# Patient Record
Sex: Female | Born: 1990
Health system: Southern US, Community
[De-identification: ages and names within clinical notes are randomized; demographics above are authoritative.]

## PROBLEM LIST (undated history)

## (undated) ENCOUNTER — Ambulatory Visit: Admission: EM | Payer: BC Managed Care – PPO | Source: Home / Self Care

---
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
## 2010-01-15 ENCOUNTER — Emergency Department: Payer: Self-pay | Admitting: Emergency Medicine

## 2010-01-19 ENCOUNTER — Emergency Department: Payer: Self-pay | Admitting: Emergency Medicine

## 2013-10-18 ENCOUNTER — Emergency Department: Payer: Self-pay | Admitting: Emergency Medicine

## 2013-12-12 ENCOUNTER — Emergency Department: Payer: Self-pay | Admitting: Emergency Medicine

## 2013-12-12 LAB — URINALYSIS, COMPLETE
BLOOD: NEGATIVE
Bacteria: NONE SEEN
Bilirubin,UR: NEGATIVE
Glucose,UR: NEGATIVE mg/dL (ref 0–75)
KETONE: NEGATIVE
LEUKOCYTE ESTERASE: NEGATIVE
NITRITE: NEGATIVE
PH: 7 (ref 4.5–8.0)
Protein: 30
RBC,UR: 34 /HPF (ref 0–5)
SPECIFIC GRAVITY: 1.027 (ref 1.003–1.030)
Squamous Epithelial: 16

## 2013-12-14 ENCOUNTER — Emergency Department: Payer: Self-pay | Admitting: Emergency Medicine

## 2013-12-15 ENCOUNTER — Emergency Department: Payer: Self-pay | Admitting: Emergency Medicine

## 2014-06-30 ENCOUNTER — Ambulatory Visit: Payer: Self-pay | Admitting: Physician Assistant

## 2014-08-04 ENCOUNTER — Ambulatory Visit: Payer: Self-pay | Admitting: Physician Assistant

## 2015-07-22 ENCOUNTER — Other Ambulatory Visit
Admission: RE | Admit: 2015-07-22 | Discharge: 2015-07-22 | Disposition: A | Discharge status: Home / Self Care | Payer: Self-pay | Source: Ambulatory Visit | Attending: Family Medicine | Admitting: Family Medicine

## 2015-07-22 ENCOUNTER — Ambulatory Visit (INDEPENDENT_AMBULATORY_CARE_PROVIDER_SITE_OTHER): Payer: Self-pay | Admitting: Family Medicine

## 2015-07-22 ENCOUNTER — Encounter: Payer: Self-pay | Admitting: Family Medicine

## 2015-07-22 VITALS — BP 90/60 | HR 86 | Temp 98.8°F | Resp 14 | Ht 61.0 in | Wt 144.0 lb

## 2015-07-22 DIAGNOSIS — Z0289 Encounter for other administrative examinations: Secondary | ICD-10-CM

## 2015-07-22 DIAGNOSIS — Z020 Encounter for examination for admission to educational institution: Secondary | ICD-10-CM | POA: Insufficient documentation

## 2015-07-22 DIAGNOSIS — Z23 Encounter for immunization: Secondary | ICD-10-CM

## 2015-07-22 LAB — POCT URINALYSIS DIPSTICK
Bilirubin, UA: NEGATIVE
Blood, UA: NEGATIVE
Glucose, UA: NEGATIVE
Ketones, UA: NEGATIVE
Leukocytes, UA: NEGATIVE
Nitrite, UA: NEGATIVE
PROTEIN UA: NEGATIVE
Spec Grav, UA: 1.02
UROBILINOGEN UA: 0.2
pH, UA: 5

## 2015-07-22 NOTE — Progress Notes (Signed)
Name: Molli BarrowsJasmine Leshawn Wambolt   MRN: [id number]    DOB: 12/14/1990   Date:07/22/2015       Progress Note  Subjective  Chief Complaint  Chief Complaint  Patient presents with  . 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   No problem-specific assessment & plan notes found for this encounter.   History reviewed. No pertinent past medical history.  History reviewed. No pertinent past surgical history.  History reviewed. No pertinent family history.  Social History   Social History  . Marital Status: Single    Spouse Name: N/A  . Number of Children: N/A  . Years of Education: N/A   Occupational History  . Not on file.   Social History Main Topics  . Smoking status: Current Every Day Smoker  . Smokeless tobacco: Not on file  . Alcohol Use: Not on file  . Drug Use: Not on file  . Sexual Activity: Yes   Other Topics Concern  . Not on file   Social History Narrative  . No narrative on file    No Known Allergies   Review of Systems  Constitutional: Negative for fever, chills, weight loss and malaise/fatigue.  HENT: Negative for ear discharge, ear pain and sore throat.   Eyes: Negative for blurred vision.  Respiratory: Negative for cough, sputum production, shortness of breath and wheezing.   Cardiovascular: Negative for chest pain, palpitations and leg swelling.  Gastrointestinal: Negative for heartburn, nausea, abdominal pain, diarrhea, constipation, blood in stool and melena.  Genitourinary: Negative for dysuria, urgency, frequency and hematuria.  Musculoskeletal: Negative for myalgias, back pain, joint pain and neck pain.  Skin: Negative for rash.  Neurological: Negative for dizziness, tingling, sensory change, focal weakness and headaches.  Endo/Heme/Allergies: Negative for environmental allergies and polydipsia. Does not bruise/bleed easily.  Psychiatric/Behavioral: Negative for depression and suicidal  ideas. The patient is not nervous/anxious and does not have insomnia.      Objective  Filed Vitals:   07/22/15 0852  BP: 90/60  Pulse: 86  Temp: 98.8 F (37.1 C)  TempSrc: Oral  Resp: 14  Height: 5\' 1"  (1.549 m)  Weight: 144 lb (65.318 kg)    Physical Exam  Constitutional: She is oriented to person, place, and time and well-developed, well-nourished, and in no distress. No distress.  HENT:  Head: Normocephalic and atraumatic.  Right Ear: External ear normal.  Left Ear: External ear normal.  Nose: Nose normal.  Mouth/Throat: Oropharynx is clear and moist.  Eyes: Conjunctivae and EOM are normal. Pupils are equal, round, and reactive to light. Right eye exhibits no discharge. Left eye exhibits no discharge.  Neck: Normal range of motion. Neck supple. No JVD present. No thyromegaly present.  Cardiovascular: Normal rate, regular rhythm, normal heart sounds and intact distal pulses.  Exam reveals no gallop and no friction rub.   No murmur heard. Pulmonary/Chest: Effort normal and breath sounds normal.  Abdominal: Soft. Bowel sounds are normal. She exhibits no mass. There is no tenderness. There is no guarding.  Musculoskeletal: Normal range of motion. She exhibits no edema.  Lymphadenopathy:    She has no cervical adenopathy.  Neurological: She is alert and oriented to person, place, and time. She has normal sensation, normal strength, normal reflexes and intact cranial nerves.  Skin: Skin is warm and dry. She is not diaphoretic.  Psychiatric: Mood and affect normal.      Assessment & Plan  Problem  List Items Addressed This Visit    None    Visit Diagnoses    School physical exam    -  Primary    Relevant Orders    Hepatitis B Surface AntiBODY    Varicella Zoster Abs, IgG/IgM    Measles/Mumps/Rubella Immunity    POCT Urinalysis Dipstick (Completed)    Need for Tdap vaccination        Relevant Orders    Tdap vaccine greater than or equal to 7yo IM (Completed)          Dr. Hayden [facility]  07/22/2015

## 2015-07-23 LAB — MEASLES/MUMPS/RUBELLA IMMUNITY
Mumps IgG: 137 AU/mL (ref >10.9)
RUBELLA: 11.5 {index} (ref >0.99)
Rubeola IgG: >300 AU/mL (ref >29.9)

## 2015-07-23 LAB — MISC LABCORP TEST (SEND OUT): LABCORP TEST CODE: 96776

## 2015-07-23 LAB — VARICELLA ZOSTER ANTIBODY, IGG: Varicella IgG: 457 index (ref >165)

## 2015-07-23 LAB — HEPATITIS B SURFACE ANTIBODY, QUANTITATIVE: HEPATITIS B-POST: 299.1 m[IU]/mL

## 2015-08-23 IMAGING — CR DG FOOT COMPLETE 3+V*L*
1 series · 3 of 3 positions shown · non-contrast
Comparison: None.

CLINICAL DATA: Pain post trauma

EXAM:
LEFT FOOT - COMPLETE 3+ VIEW

[Series 1: ap · 0.17mm/px · 3 of 3 slices shown]
[im 1/3]
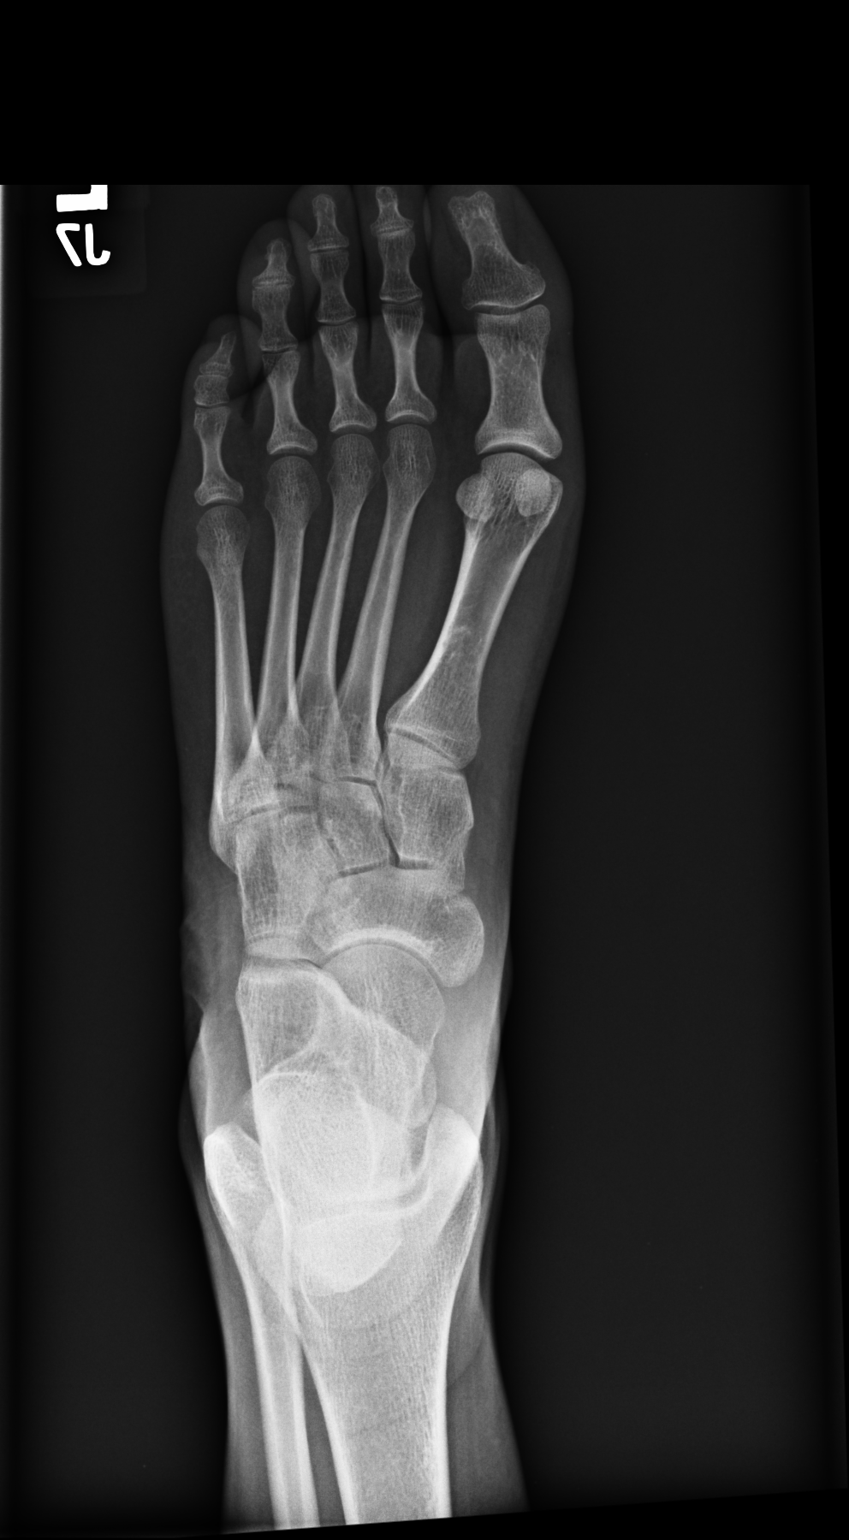
[im 2/3]
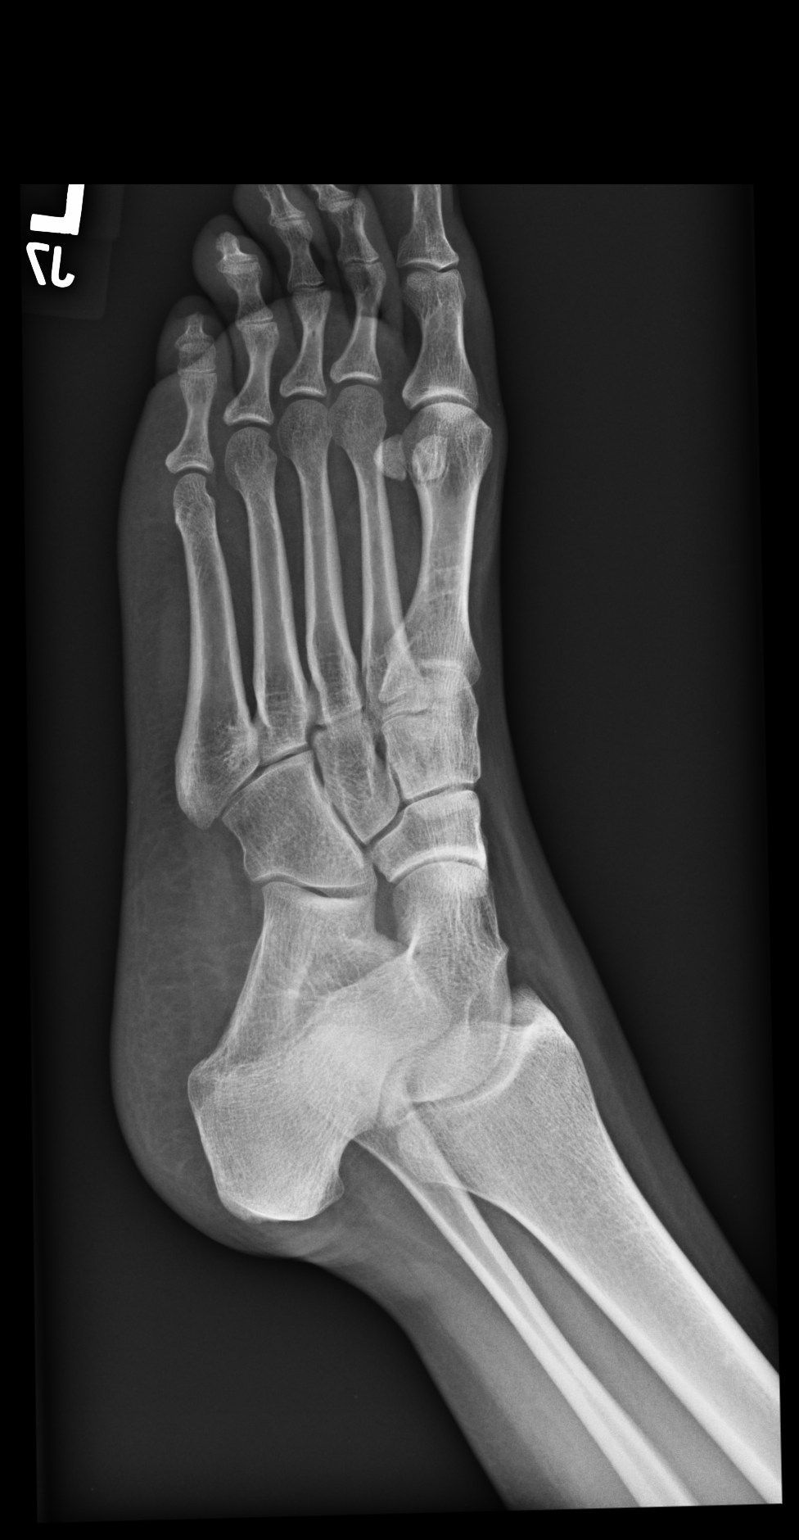
[im 3/3]
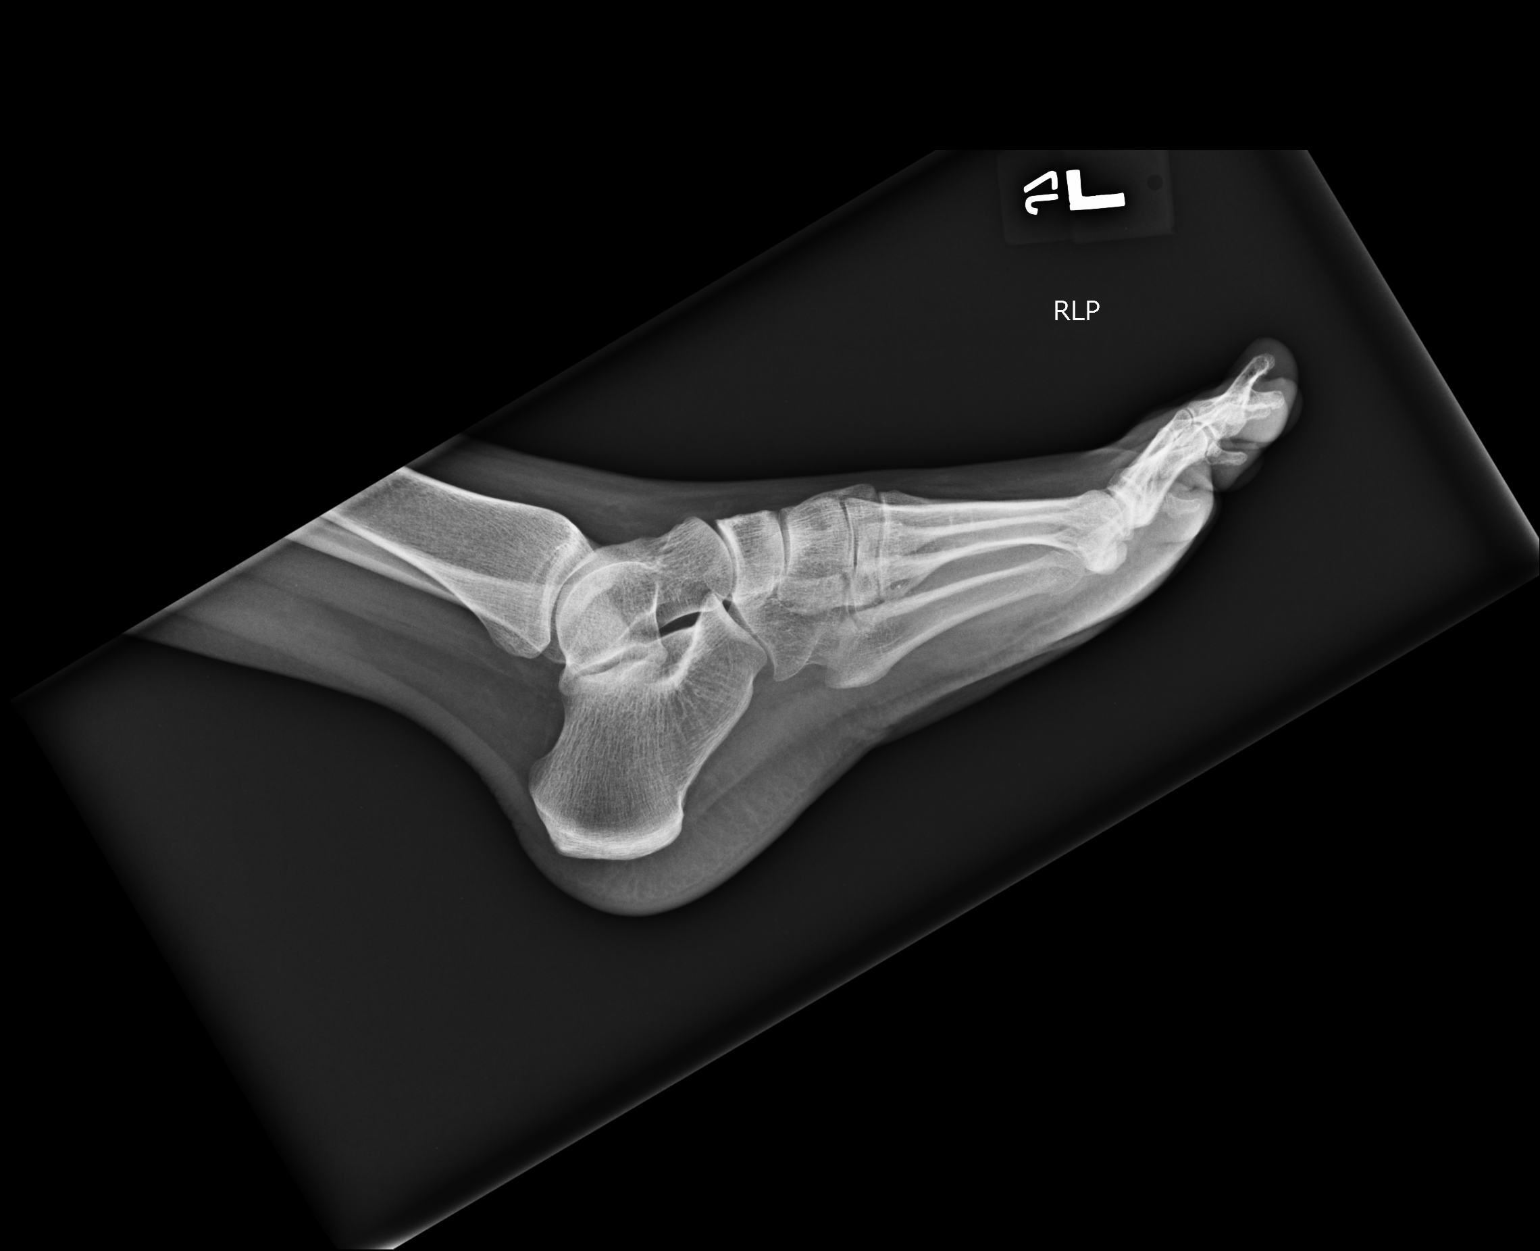

[3 of 3 positions shown; findings below may reference images not displayed]

FINDINGS: Frontal, oblique, and lateral views were obtained. There is no
fracture or dislocation. Joint spaces appear intact. No erosive
change.
IMPRESSION: No abnormality noted.

## 2017-04-15 ENCOUNTER — Emergency Department
Admission: EM | Admit: 2017-04-15 | Discharge: 2017-04-15 | Disposition: A | Discharge status: Home / Self Care | Payer: Managed Care, Other (non HMO) | Attending: Emergency Medicine | Admitting: Emergency Medicine

## 2017-04-15 ENCOUNTER — Emergency Department: Payer: Managed Care, Other (non HMO)

## 2017-04-15 ENCOUNTER — Encounter: Payer: Self-pay | Admitting: Emergency Medicine

## 2017-04-15 DIAGNOSIS — R1032 Left lower quadrant pain: Secondary | ICD-10-CM | POA: Diagnosis present

## 2017-04-15 DIAGNOSIS — F17200 Nicotine dependence, unspecified, uncomplicated: Secondary | ICD-10-CM | POA: Diagnosis not present

## 2017-04-15 LAB — COMPREHENSIVE METABOLIC PANEL
ALBUMIN: 4.1 g/dL (ref 3.5–5.0)
ALT: 11 U/L — ABNORMAL LOW (ref 14–54)
ANION GAP: 7 (ref 5–15)
AST: 22 U/L (ref 15–41)
Alkaline Phosphatase: 76 U/L (ref 38–126)
BUN: 11 mg/dL (ref 6–20)
CO2: 25 mmol/L (ref 22–32)
Calcium: 9 mg/dL (ref 8.9–10.3)
Chloride: 106 mmol/L (ref 101–111)
Creatinine, Ser: 0.81 mg/dL (ref 0.44–1.00)
GFR calc Af Amer: >60 mL/min (ref >60)
GFR calc non Af Amer: >60 mL/min (ref >60)
GLUCOSE: 118 mg/dL — AB (ref 65–99)
POTASSIUM: 3.6 mmol/L (ref 3.5–5.1)
SODIUM: 138 mmol/L (ref 135–145)
TOTAL PROTEIN: 7.6 g/dL (ref 6.5–8.1)
Total Bilirubin: 0.3 mg/dL (ref 0.3–1.2)

## 2017-04-15 LAB — URINALYSIS, COMPLETE (UACMP) WITH MICROSCOPIC
Bilirubin Urine: NEGATIVE
Glucose, UA: NEGATIVE mg/dL
Ketones, ur: NEGATIVE mg/dL
Leukocytes, UA: NEGATIVE
NITRITE: NEGATIVE
PROTEIN: 30 mg/dL — AB
Specific Gravity, Urine: 1.026 (ref 1.005–1.030)
pH: 5 (ref 5.0–8.0)

## 2017-04-15 LAB — CBC
HCT: 40.9 % (ref 35.0–47.0)
HEMOGLOBIN: 14 g/dL (ref 12.0–16.0)
MCH: 29.4 pg (ref 26.0–34.0)
MCHC: 34.1 g/dL (ref 32.0–36.0)
MCV: 86.3 fL (ref 80.0–100.0)
Platelets: 408 10*3/uL (ref 150–440)
RBC: 4.74 MIL/uL (ref 3.80–5.20)
RDW: 13 % (ref 11.5–14.5)
WBC: 16.5 10*3/uL — ABNORMAL HIGH (ref 3.6–11.0)

## 2017-04-15 LAB — WET PREP, GENITAL
Clue Cells Wet Prep HPF POC: NONE SEEN
SPERM: NONE SEEN
Trich, Wet Prep: NONE SEEN
Yeast Wet Prep HPF POC: NONE SEEN

## 2017-04-15 LAB — POCT PREGNANCY, URINE: PREG TEST UR: NEGATIVE

## 2017-04-15 LAB — CHLAMYDIA/NGC RT PCR (ARMC ONLY)
Chlamydia Tr: NOT DETECTED
N GONORRHOEAE: NOT DETECTED

## 2017-04-15 LAB — LIPASE, BLOOD: Lipase: 24 U/L (ref 11–51)

## 2017-04-15 MED ORDER — AZITHROMYCIN 500 MG PO TABS
1000.0000 mg | ORAL_TABLET | Freq: Once | ORAL | Status: AC
  Administered 2017-04-15: 1000 mg via ORAL
  Filled 2017-04-15: qty 2

## 2017-04-15 MED ORDER — CEFTRIAXONE SODIUM 250 MG IJ SOLR
250.0000 mg | Freq: Once | INTRAMUSCULAR | Status: AC
  Administered 2017-04-15: 250 mg via INTRAMUSCULAR
  Filled 2017-04-15: qty 250

## 2017-04-15 NOTE — ED Notes (Addendum)
Pt. States pain to lt. Flank that radiates to back and down lt. Leg.  Pt. States symptoms started 2 weeks ago, but has gotten progressively worse.  Pt. States when she sits up lt. Flank/LLQ pain.

## 2017-04-15 NOTE — ED Provider Notes (Signed)
[facility] Emergency Department Provider Note  ____________________________________________   First MD Initiated Contact with Patient 04/15/17 0153     (approximate)  I have reviewed the triage vital signs and the nursing notes.   HISTORY  Chief Complaint Abdominal Pain    HPI Isabella Smith is a 26 y.o. female who self presents to the emergency department with 2 weeks of left lower quadrant abdominal pain. Associated with nausea but no vomiting. The pain is constant and always there at least in a mild way although comes in waves that are more intense. Nothing in particular seems to make it better or worse. It occasionally radiates down her left leg and up her left flank. She denies dysuria frequency or hesitancy. Shedenies abdominal surgical history. She denies diarrhea or constipation. She is currently menstruating.   No past medical history on file.  There are no active problems to display for this patient.   No past surgical history on file.  Prior to Admission medications   Not on File    Allergies Patient has no known allergies.  No family history on file.  Social History Social History  Substance Use Topics  . Smoking status: Current Every Day Smoker  . Smokeless tobacco: Not on file  . Alcohol use Not on file    Review of Systems Constitutional: No fever/chills Eyes: No visual changes. ENT: No sore throat. Cardiovascular: Denies chest pain. Respiratory: Denies shortness of breath. Gastrointestinal: Positiveabdominal pain.  Positivenausea, no vomiting.  No diarrhea.  No constipation. Genitourinary: Negative for dysuria. Musculoskeletal:  Positivefor back pain. Skin: Negative for rash. Neurological: Negative for headaches, focal weakness or numbness.   ____________________________________________   PHYSICAL EXAM:  VITAL SIGNS: ED Triage Vitals  Enc Vitals Group     BP 04/15/17 0028 119/79     Pulse Rate  04/15/17 0028 81     Resp 04/15/17 0028 18     Temp 04/15/17 0028 98.8 F (37.1 C)     Temp Source 04/15/17 0028 Oral     SpO2 04/15/17 0028 100 %     Weight 04/15/17 0027 148 lb (67.1 kg)     Height 04/15/17 0027 5\' 1"  (1.549 m)     Head Circumference --      Peak Flow --      Pain Score 04/15/17 0025 0     Pain Loc --      Pain Edu? --      Excl. in GC? --     Constitutional: Alert and oriented 4 pleasant cooperative speaks in full clear sentences no diaphoresis Eyes: PERRL EOMI. Head: Atraumatic. Nose: No congestion/rhinnorhea. Mouth/Throat: No trismus Neck: No stridor.   Cardiovascular: Normal rate, regular rhythm. Grossly normal heart sounds.  Good peripheral circulation. Respiratory: Normal respiratory effort.  No retractions. Lungs CTAB and moving good air Gastrointestinal:  Soft nondistended mild left lower quadrant tenderness with no rebound or guarding no peritonitis no costovertebral tenderness Genitourinary: Pelvic exam performed with female chaperone nurse Rebecca: Os closed small amount of dried blood in the vault no cervical motion tenderness no adnexal tenderness or uterine tenderness Musculoskeletal: No lower extremity edema   Neurologic:  Normal speech and language. No gross focal neurologic deficits are appreciated. Skin:  Skin is warm, dry and intact. No rash noted. Psychiatric: Mood and affect are normal. Speech and behavior are normal.    ____________________________________________   DIFFERENTIAL includes but not limited to  ovarian cyst, ovarian torsion, pyelonephritis, urinary tract infection, kidney  stone, diverticulitis ____________________________________________   LABS (all labs ordered are listed, but only abnormal results are displayed)  Labs Reviewed  WET PREP, GENITAL - Abnormal; Notable for the following:       Result Value   WBC, Wet Prep HPF POC MODERATE (*)    All other components within normal limits  COMPREHENSIVE METABOLIC PANEL  - Abnormal; Notable for the following:    Glucose, Bld 118 (*)    ALT 11 (*)    All other components within normal limits  CBC - Abnormal; Notable for the following:    WBC 16.5 (*)    All other components within normal limits  URINALYSIS, COMPLETE (UACMP) WITH MICROSCOPIC - Abnormal; Notable for the following:    Color, Urine YELLOW (*)    APPearance HAZY (*)    Hgb urine dipstick LARGE (*)    Protein, ur 30 (*)    Bacteria, UA RARE (*)    Squamous Epithelial / LPF 0-5 (*)    All other components within normal limits  CHLAMYDIA/NGC RT PCR ([facility] ONLY)  LIPASE, BLOOD  POC URINE PREG, ED  POCT PREGNANCY, URINE   Hematuria with no evidence of infection elevated white count is nonspecific __________________________________________  EKG   ____________________________________________  RADIOLOGY  Pelvic ultrasound with no acute disease CT stone with no acute disease noted ____________________________________________   PROCEDURES  Procedure(s) performed: no  Procedures  Critical Care performed: no  Observation: no ____________________________________________   INITIAL IMPRESSION / ASSESSMENT AND PLAN / ED COURSE  Pertinent labs & imaging results that were available during my care of the patient were reviewed by me and considered in my medical decision making (see chart for details).  The patient arrives well-appearing with a relatively benign abdominal exam. No costovertebral tenderness. Doubt infectious etiology. She does report constant aching left lower quadrant pain and it seems to be worse now that she is menstruating which could be ovarian in etiology. Her history is not entirely consistent with ovarian torsion. Ultrasound is pending and if the ultrasound is negative we may possibly pursue a CT stone protocol.    ----------------------------------------- 3:49 AM on 04/15/2017 -----------------------------------------  The patient's pelvic ultrasound  is negative for acute pathology. We discussed other etiologies of left lower quadrant pain and the patient stated that she was treated for gonorrhea in March of this year and is concerned she may have recurrent disease. Pelvic exam performed and is unremarkable. The other etiology and concerned about would be a kidney stone so I will order a CT stone now.   The patient's CT scan is negative for acute disease. Unclear etiology of her pelvic pain. I offered the patient presumptive treatment for gonorrhea and chlamydia now versus waiting for the results and possible treatment tomorrow and she opted for treatment today which I think is reasonable. She clearly does not have a tubo-ovarian abscess. Ceftriaxone and azithromycin now and outpatient management. ____________________________________________   FINAL CLINICAL IMPRESSION(S) / ED DIAGNOSES  Final diagnoses:  Left lower quadrant pain      NEW MEDICATIONS STARTED DURING THIS VISIT:  There are no discharge medications for this patient.    Note:  This document was prepared using Dragon voice recognition software and may include unintentional dictation errors.     Merrily Brittleifenbark, Tongela Encinas, MD 04/15/17 0630

## 2017-04-15 NOTE — ED Triage Notes (Signed)
Patient reports left lower abdominal pain that radiates down left leg and into back.  Patient denies urinary symptoms.

## 2017-04-15 NOTE — Discharge Instructions (Signed)
Please make an appointment to follow-up with your primary care physician next week for recheck and return to the emergency department sooner for any concerns.  It was a pleasure to take care of you today, and thank you for coming to our emergency department.  If you have any questions or concerns before leaving please ask the nurse to grab me and I'm more than happy to go through your aftercare instructions again.  If you were prescribed any opioid pain medication today such as Norco, Vicodin, Percocet, morphine, hydrocodone, or oxycodone please make sure you do not drive when you are taking this medication as it can alter your ability to drive safely.  If you have any concerns once you are home that you are not improving or are in fact getting worse before you can make it to your follow-up appointment, please do not hesitate to call 911 and come back for further evaluation.  Merrily BrittleNeil Ashley Bultema, MD  Results for orders placed or performed during the hospital encounter of 04/15/17  Wet prep, genital  Result Value Ref Range   Yeast Wet Prep HPF POC NONE SEEN NONE SEEN   Trich, Wet Prep NONE SEEN NONE SEEN   Clue Cells Wet Prep HPF POC NONE SEEN NONE SEEN   WBC, Wet Prep HPF POC MODERATE (A) NONE SEEN   Sperm NONE SEEN   Lipase, blood  Result Value Ref Range   Lipase 24 11 - 51 U/L  Comprehensive metabolic panel  Result Value Ref Range   Sodium 138 135 - 145 mmol/L   Potassium 3.6 3.5 - 5.1 mmol/L   Chloride 106 101 - 111 mmol/L   CO2 25 22 - 32 mmol/L   Glucose, Bld 118 (H) 65 - 99 mg/dL   BUN 11 6 - 20 mg/dL   Creatinine, Ser 1.610.81 0.44 - 1.00 mg/dL   Calcium 9.0 8.9 - 09.610.3 mg/dL   Total Protein 7.6 6.5 - 8.1 g/dL   Albumin 4.1 3.5 - 5.0 g/dL   AST 22 15 - 41 U/L   ALT 11 (L) 14 - 54 U/L   Alkaline Phosphatase 76 38 - 126 U/L   Total Bilirubin 0.3 0.3 - 1.2 mg/dL   GFR calc non Af Amer >60 >60 mL/min   GFR calc Af Amer >60 >60 mL/min   Anion gap 7 5 - 15  CBC  Result Value Ref Range    WBC 16.5 (H) 3.6 - 11.0 K/uL   RBC 4.74 3.80 - 5.20 MIL/uL   Hemoglobin 14.0 12.0 - 16.0 g/dL   HCT 04.540.9 40.935.0 - 81.147.0 %   MCV 86.3 80.0 - 100.0 fL   MCH 29.4 26.0 - 34.0 pg   MCHC 34.1 32.0 - 36.0 g/dL   RDW 91.413.0 78.211.5 - 95.614.5 %   Platelets 408 150 - 440 K/uL  Urinalysis, Complete w Microscopic  Result Value Ref Range   Color, Urine YELLOW (A) YELLOW   APPearance HAZY (A) CLEAR   Specific Gravity, Urine 1.026 1.005 - 1.030   pH 5.0 5.0 - 8.0   Glucose, UA NEGATIVE NEGATIVE mg/dL   Hgb urine dipstick LARGE (A) NEGATIVE   Bilirubin Urine NEGATIVE NEGATIVE   Ketones, ur NEGATIVE NEGATIVE mg/dL   Protein, ur 30 (A) NEGATIVE mg/dL   Nitrite NEGATIVE NEGATIVE   Leukocytes, UA NEGATIVE NEGATIVE   RBC / HPF 6-30 0 - 5 RBC/hpf   WBC, UA 0-5 0 - 5 WBC/hpf   Bacteria, UA RARE (A) NONE SEEN  Squamous Epithelial / LPF 0-5 (A) NONE SEEN   Mucous PRESENT   Pregnancy, urine POC  Result Value Ref Range   Preg Test, Ur NEGATIVE NEGATIVE   [place] Transvaginal Non-ob  Result Date: 04/15/2017 CLINICAL DATA:  Initial evaluation for acute left flank and lower quadrant pain for 2 weeks. EXAM: TRANSABDOMINAL AND TRANSVAGINAL ULTRASOUND OF PELVIS DOPPLER ULTRASOUND OF OVARIES TECHNIQUE: Both transabdominal and transvaginal ultrasound examinations of the pelvis were performed. Transabdominal technique was performed for global imaging of the pelvis including uterus, ovaries, adnexal regions, and pelvic cul-de-sac. It was necessary to proceed with endovaginal exam following the transabdominal exam to visualize the uterus and ovaries. Color and duplex Doppler ultrasound was utilized to evaluate blood flow to the ovaries. COMPARISON:  None. FINDINGS: Uterus Measurements: 7.3 x 3.0 x 3.9 cm. No fibroids or other mass visualized. Endometrium Thickness: 4.7 mm.  No focal abnormality visualized. Right ovary Measurements: 3.0 x 1.3 x 2.7 cm. Normal appearance/no adnexal mass. Left ovary Measurements: 3.0 x 1.2 x 3.3 cm.  Normal appearance/no adnexal mass. Pulsed Doppler evaluation of both ovaries demonstrates normal low-resistance arterial and venous waveforms. Other findings No abnormal free fluid. IMPRESSION: Normal pelvic ultrasound.  No acute abnormality identified. Electronically Signed   By: Rise MuBenjamin  McClintock M.D.   On: 04/15/2017 03:33   [place] Pelvis Complete  Result Date: 04/15/2017 CLINICAL DATA:  Initial evaluation for acute left flank and lower quadrant pain for 2 weeks. EXAM: TRANSABDOMINAL AND TRANSVAGINAL ULTRASOUND OF PELVIS DOPPLER ULTRASOUND OF OVARIES TECHNIQUE: Both transabdominal and transvaginal ultrasound examinations of the pelvis were performed. Transabdominal technique was performed for global imaging of the pelvis including uterus, ovaries, adnexal regions, and pelvic cul-de-sac. It was necessary to proceed with endovaginal exam following the transabdominal exam to visualize the uterus and ovaries. Color and duplex Doppler ultrasound was utilized to evaluate blood flow to the ovaries. COMPARISON:  None. FINDINGS: Uterus Measurements: 7.3 x 3.0 x 3.9 cm. No fibroids or other mass visualized. Endometrium Thickness: 4.7 mm.  No focal abnormality visualized. Right ovary Measurements: 3.0 x 1.3 x 2.7 cm. Normal appearance/no adnexal mass. Left ovary Measurements: 3.0 x 1.2 x 3.3 cm. Normal appearance/no adnexal mass. Pulsed Doppler evaluation of both ovaries demonstrates normal low-resistance arterial and venous waveforms. Other findings No abnormal free fluid. IMPRESSION: Normal pelvic ultrasound.  No acute abnormality identified. Electronically Signed   By: Rise MuBenjamin  McClintock M.D.   On: 04/15/2017 03:33   [place] Art/ven Flow Abd Pelv Doppler  Result Date: 04/15/2017 CLINICAL DATA:  Initial evaluation for acute left flank and lower quadrant pain for 2 weeks. EXAM: TRANSABDOMINAL AND TRANSVAGINAL ULTRASOUND OF PELVIS DOPPLER ULTRASOUND OF OVARIES TECHNIQUE: Both transabdominal and transvaginal ultrasound  examinations of the pelvis were performed. Transabdominal technique was performed for global imaging of the pelvis including uterus, ovaries, adnexal regions, and pelvic cul-de-sac. It was necessary to proceed with endovaginal exam following the transabdominal exam to visualize the uterus and ovaries. Color and duplex Doppler ultrasound was utilized to evaluate blood flow to the ovaries. COMPARISON:  None. FINDINGS: Uterus Measurements: 7.3 x 3.0 x 3.9 cm. No fibroids or other mass visualized. Endometrium Thickness: 4.7 mm.  No focal abnormality visualized. Right ovary Measurements: 3.0 x 1.3 x 2.7 cm. Normal appearance/no adnexal mass. Left ovary Measurements: 3.0 x 1.2 x 3.3 cm. Normal appearance/no adnexal mass. Pulsed Doppler evaluation of both ovaries demonstrates normal low-resistance arterial and venous waveforms. Other findings No abnormal free fluid. IMPRESSION: Normal pelvic ultrasound.  No acute abnormality identified. Electronically  Signed   By: Rise Mu M.D.   On: 04/15/2017 03:33   Ct Renal Stone Study  Result Date: 04/15/2017 CLINICAL DATA:  Acute onset of left flank pain, radiating down the left leg. Initial encounter. EXAM: CT ABDOMEN AND PELVIS WITHOUT CONTRAST TECHNIQUE: Multidetector CT imaging of the abdomen and pelvis was performed following the standard protocol without IV contrast. COMPARISON:  Pelvic ultrasound performed earlier today at 2:29 a.m. FINDINGS: Lower chest: The visualized lung bases are grossly clear. The visualized portions of the mediastinum are unremarkable. Hepatobiliary: The liver is unremarkable in appearance. The gallbladder is unremarkable in appearance. The common bile duct remains normal in caliber. Pancreas: The pancreas is within normal limits. Spleen: The spleen is unremarkable in appearance. Adrenals/Urinary Tract: The adrenal glands are unremarkable in appearance. A 3 mm stone is noted at the interpole region of the right kidney. The kidneys are  otherwise unremarkable. There is no evidence of hydronephrosis. No obstructing ureteral stones are identified. No perinephric stranding is seen. Stomach/Bowel: The stomach is unremarkable in appearance. The small bowel is within normal limits. The appendix is normal in caliber, without evidence of appendicitis. Minimal diverticulosis is noted along the ascending colon, without evidence of diverticulitis. Vascular/Lymphatic: The abdominal aorta is unremarkable in appearance. The inferior vena cava is grossly unremarkable. No retroperitoneal lymphadenopathy is seen. No pelvic sidewall lymphadenopathy is identified. Reproductive: The bladder is mildly distended and within normal limits. The uterus is grossly unremarkable in appearance. The ovaries are relatively symmetric. No suspicious adnexal masses are seen. Other: No additional soft tissue abnormalities are seen. Musculoskeletal: No acute osseous abnormalities are identified. The visualized musculature is unremarkable in appearance. IMPRESSION: 1. No acute abnormality seen within the abdomen or pelvis. 2. 3 mm nonobstructing stone at the interpole region of the right kidney. 3. Minimal diverticulosis along the ascending colon, without evidence of diverticulitis. Electronically Signed   By: Roanna Raider M.D.   On: 04/15/2017 04:30

## 2018-08-10 IMAGING — US US PELVIS COMPLETE
1 series · 13 of 25 positions shown · non-contrast
Comparison: None.

CLINICAL DATA: Initial evaluation for acute left flank and lower
quadrant pain for 2 weeks.

EXAM:
TRANSABDOMINAL AND TRANSVAGINAL ULTRASOUND OF PELVIS
DOPPLER ULTRASOUND OF OVARIES
TECHNIQUE: Both transabdominal and transvaginal ultrasound examinations of the
pelvis were performed. Transabdominal technique was performed for
global imaging of the pelvis including uterus, ovaries, adnexal
regions, and pelvic cul-de-sac.
It was necessary to proceed with endovaginal exam following the
transabdominal exam to visualize the uterus and ovaries. Color and
duplex Doppler ultrasound was utilized to evaluate blood flow to the
ovaries.

[Series 1: us pelvis complete · 0.20mm/px · 13 of 101 slices shown]
[im 1/101]
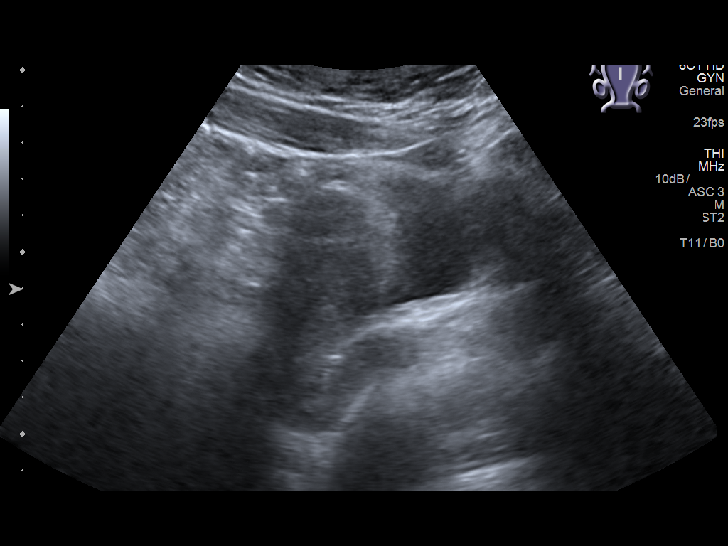
[im 9/101]
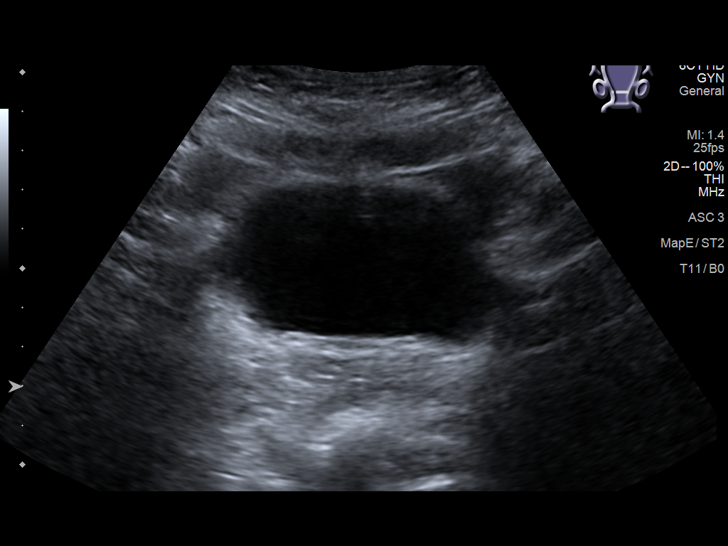
[im 17/101]
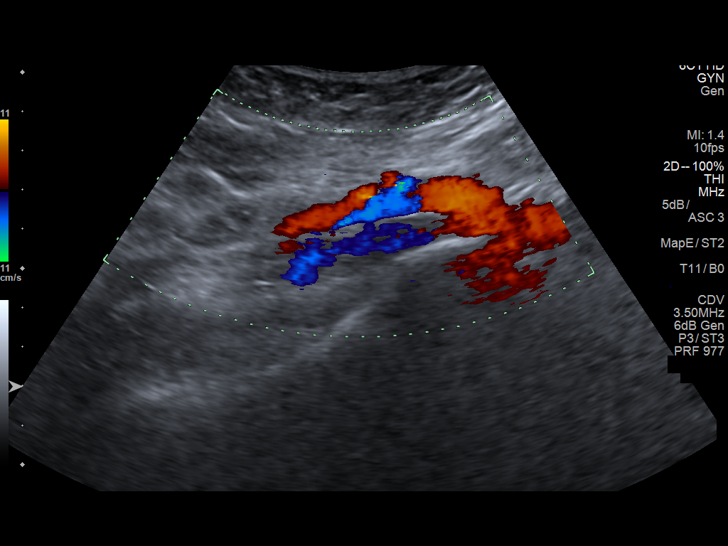
[im 26/101]
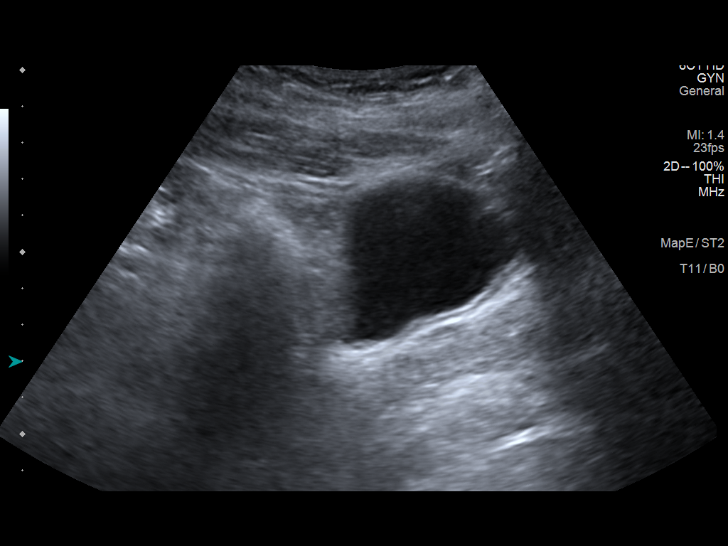
[im 34/101]
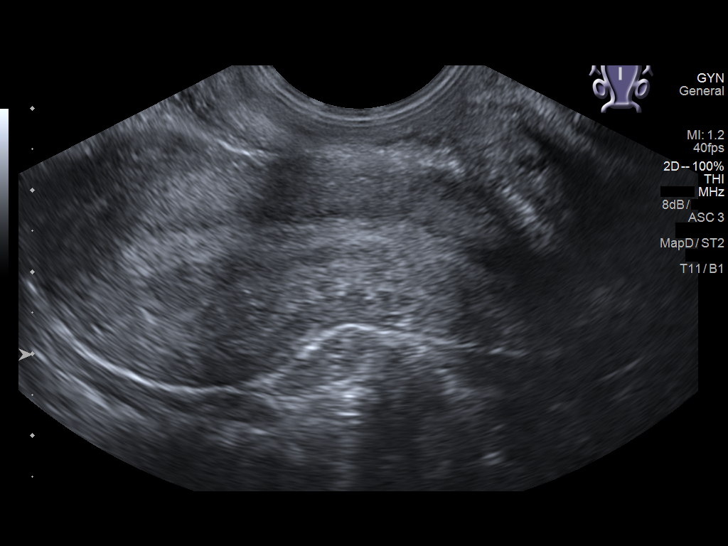
[im 42/101]
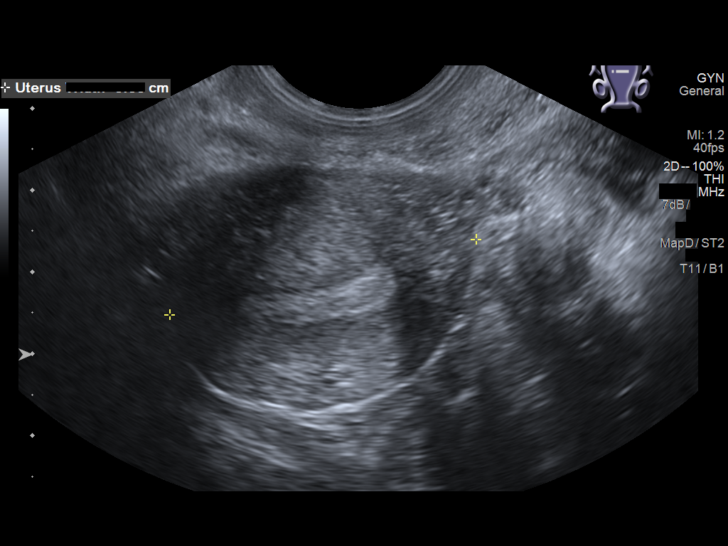
[im 51/101]
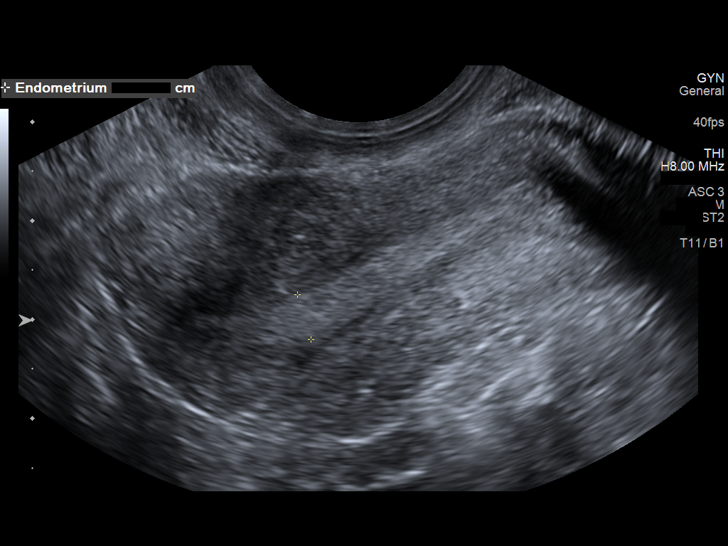
[im 59/101]
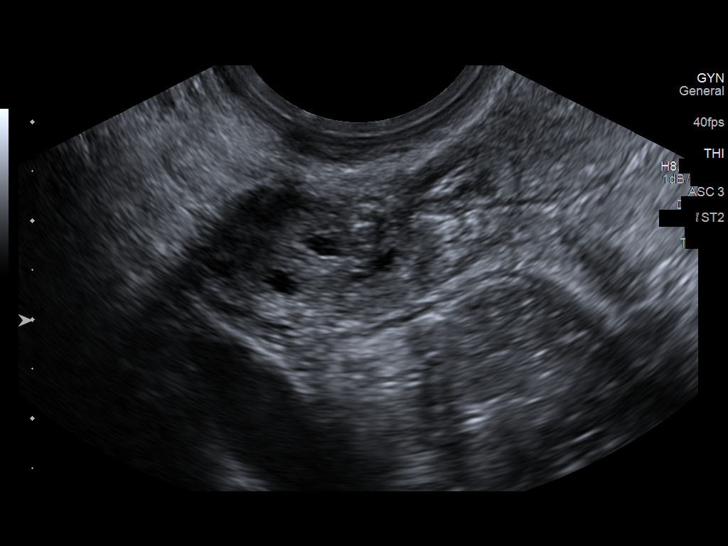
[im 67/101]
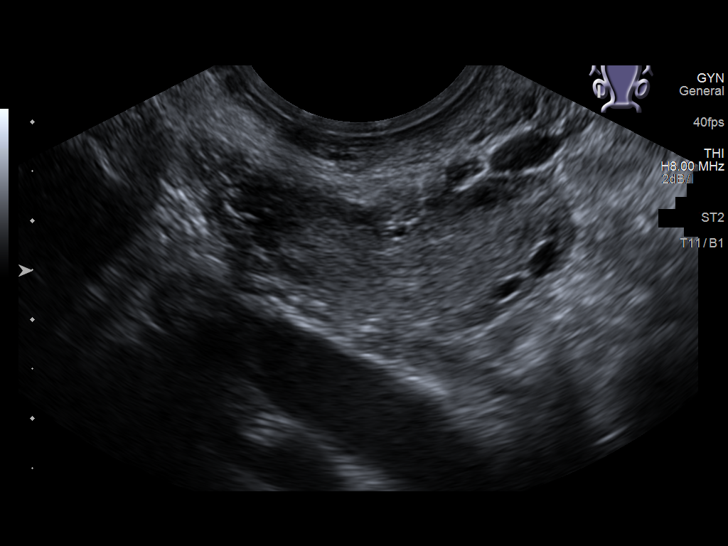
[im 76/101]
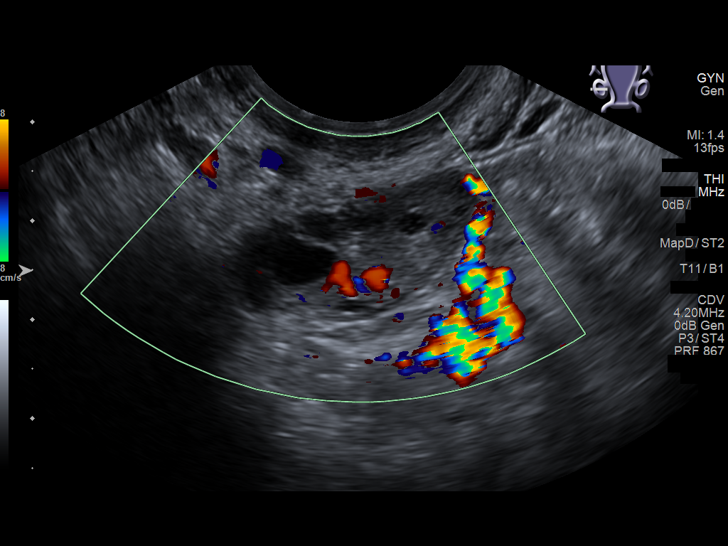
[im 84/101]
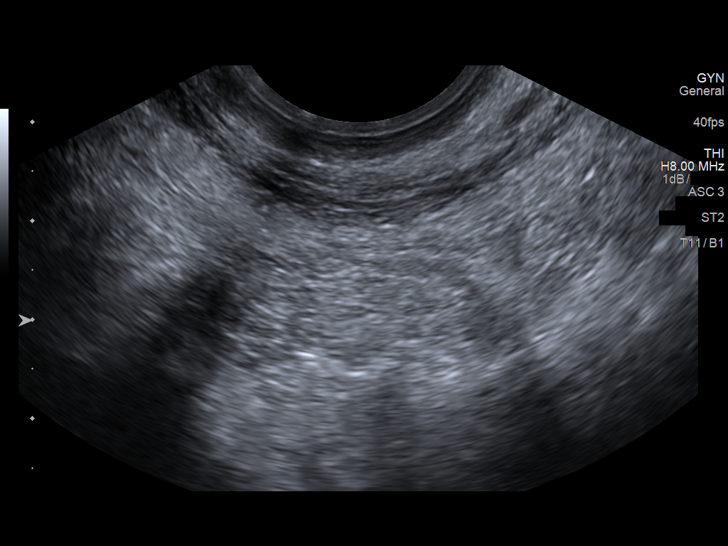
[im 92/101]
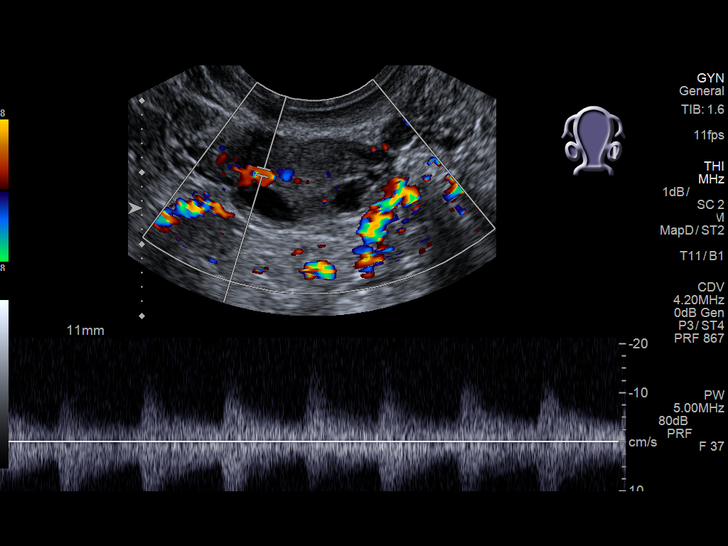
[im 101/101]
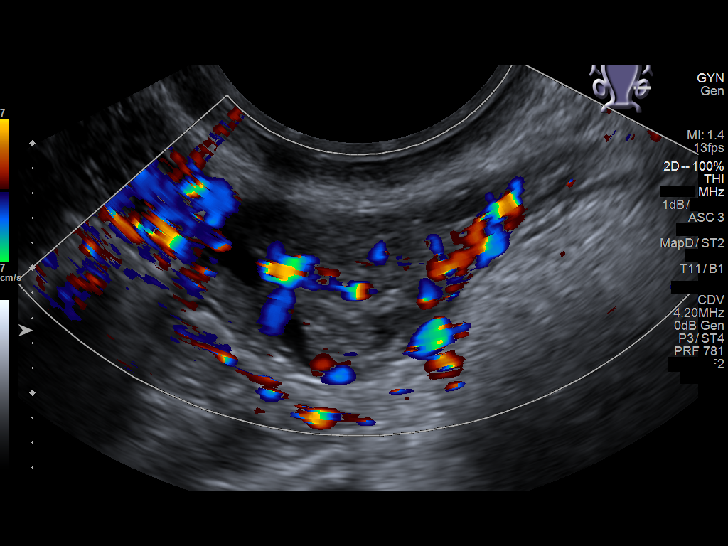

[13 of 25 positions shown; findings below may reference images not displayed]

FINDINGS: Uterus

Measurements: 7.3 x 3.0 x 3.9 cm. No fibroids or other mass
visualized.

Endometrium

Thickness: 4.7 mm.  No focal abnormality visualized.

Right ovary

Measurements: 3.0 x 1.3 x 2.7 cm. Normal appearance/no adnexal mass.

Left ovary

Measurements: 3.0 x 1.2 x 3.3 cm. Normal appearance/no adnexal mass.

Pulsed Doppler evaluation of both ovaries demonstrates normal
low-resistance arterial and venous waveforms.

Other findings

No abnormal free fluid.
IMPRESSION: Normal pelvic ultrasound.  No acute abnormality identified.

## 2019-02-18 IMAGING — CT CT RENAL STONE PROTOCOL
3 of 4 series · 9 of 46 positions shown, 16 images · non-contrast
Comparison: Pelvic ultrasound performed earlier today at [DATE] a.m.

CLINICAL DATA: Acute onset of left flank pain, radiating down the
left leg. Initial encounter.

EXAM:
CT ABDOMEN AND PELVIS WITHOUT CONTRAST
TECHNIQUE: Multidetector CT imaging of the abdomen and pelvis was performed
following the standard protocol without IV contrast.

[Series 4: lung bases · axial · 0.66mm/px · z∈[+148,+228]mm · 5 of 24 slices shown, 10 images]
[im 4/24  soft-tissue]
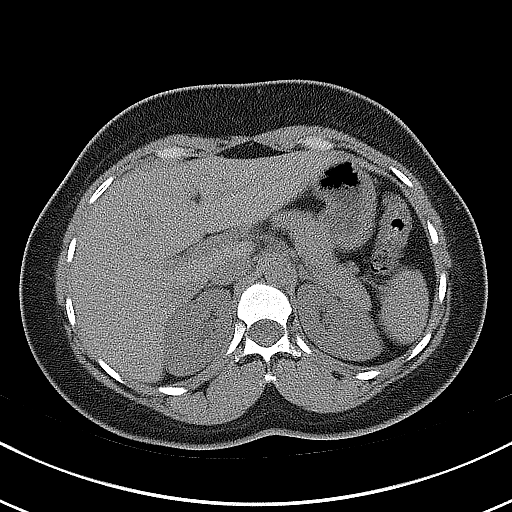
[im 4/24  bone]
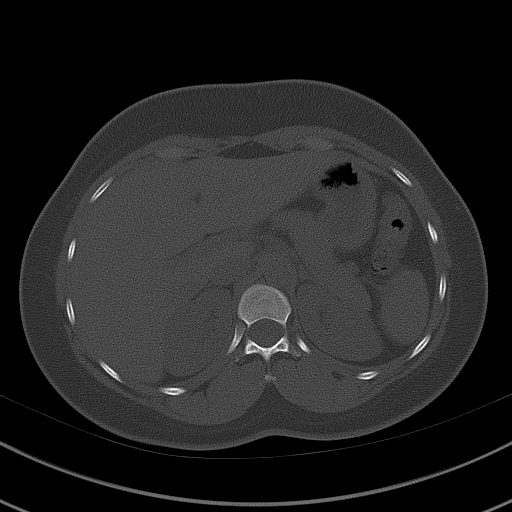
[im 8/24  soft-tissue]
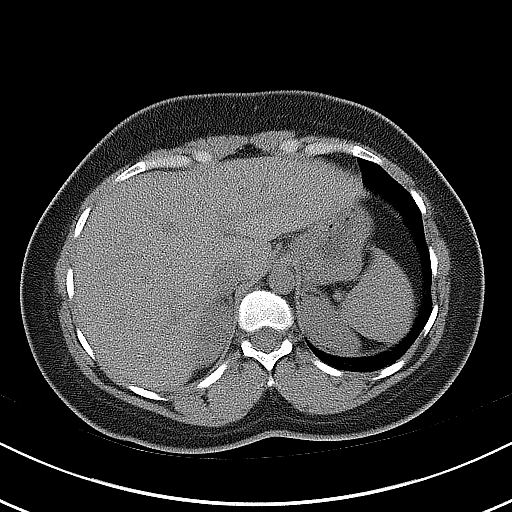
[im 8/24  lung]
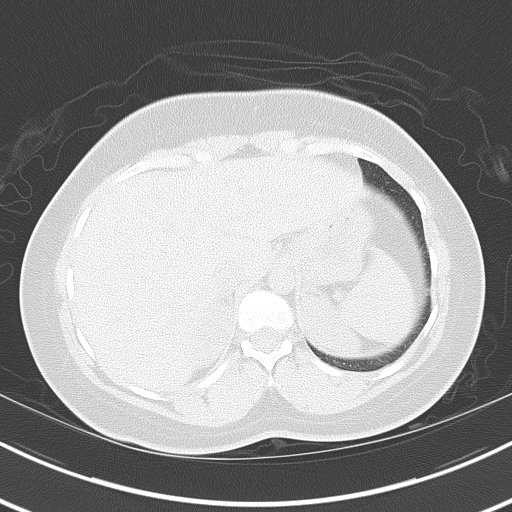
[im 12/24  soft-tissue]
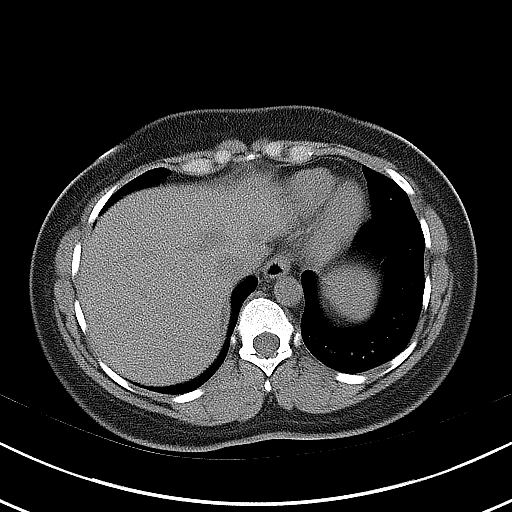
[im 12/24  lung]
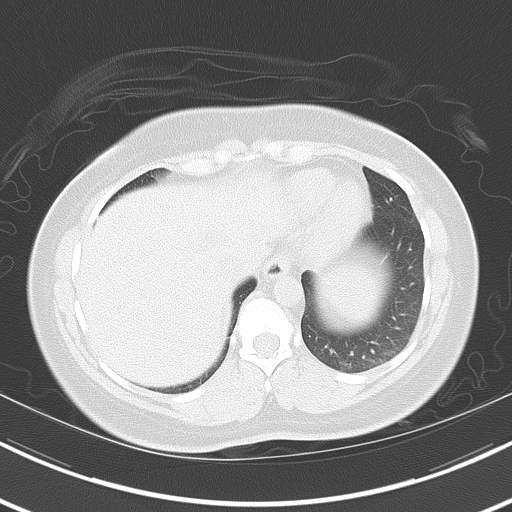
[im 16/24  soft-tissue]
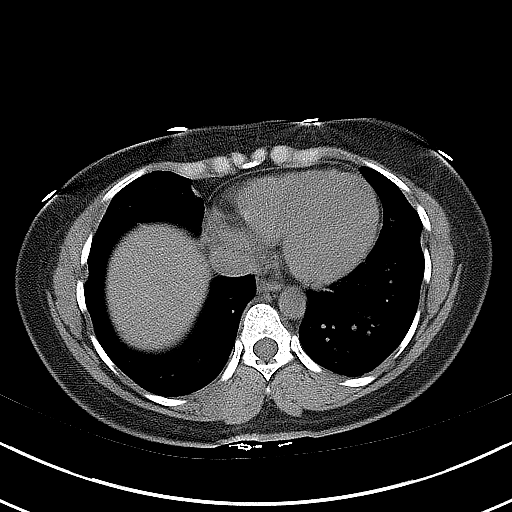
[im 16/24  lung]
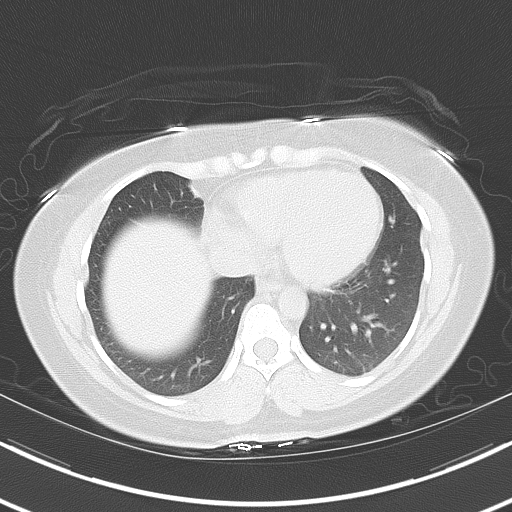
[im 20/24  soft-tissue]
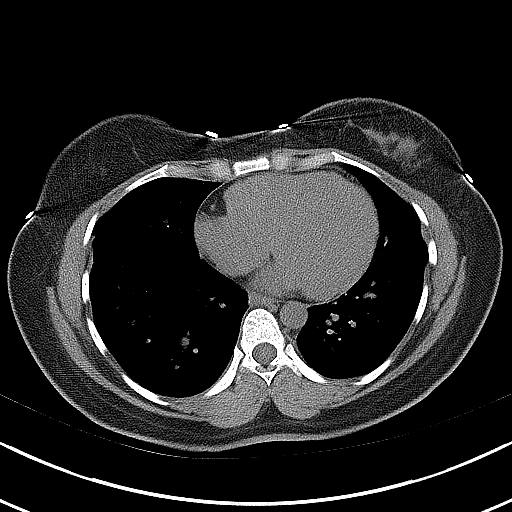
[im 20/24  lung]
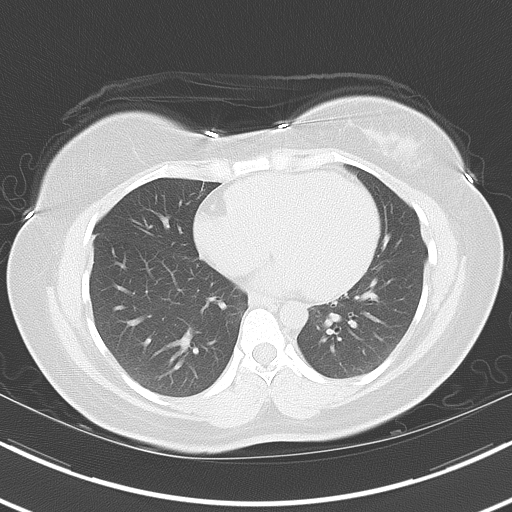

[Series 5: coronal · coronal · 0.71mm/px · 3 of 112 slices shown, 4 images]
[im 38/112  soft-tissue]
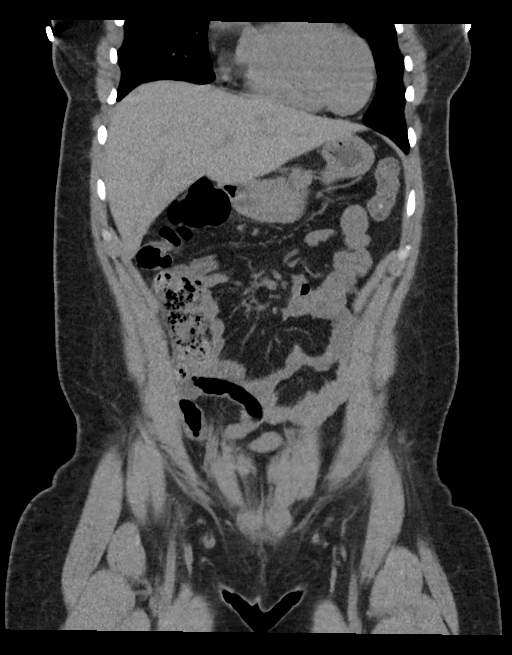
[im 50/112  soft-tissue]
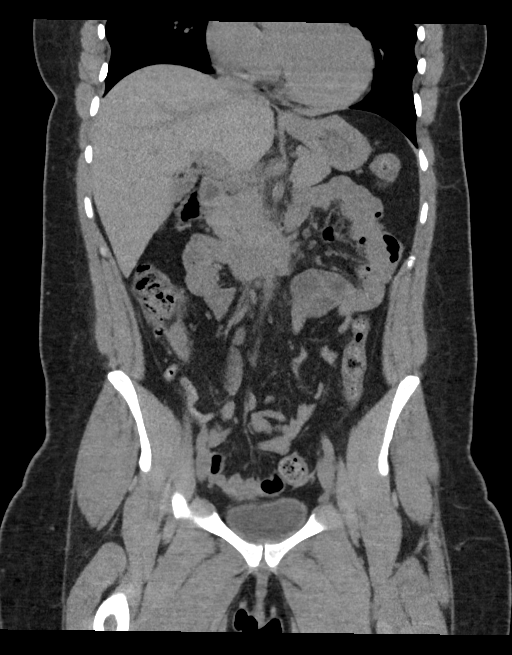
[im 50/112  bone]
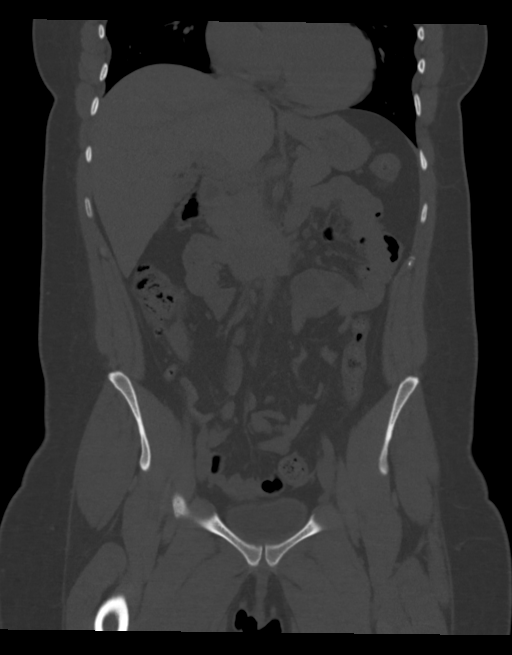
[im 62/112  soft-tissue]
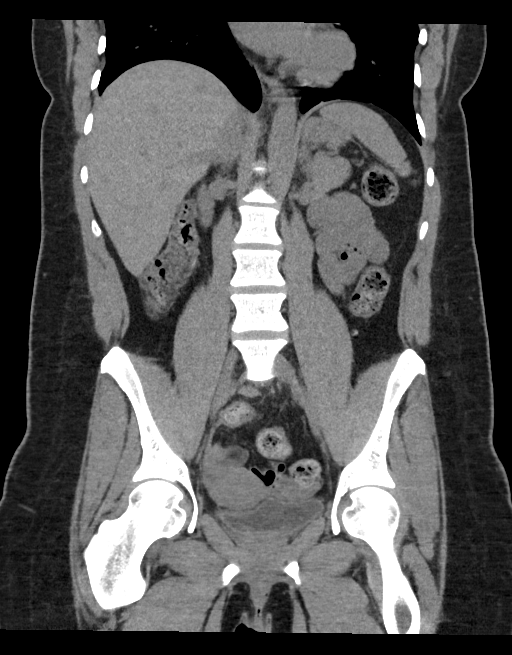

[Series 6: sagittal · sagittal · 0.56mm/px · 1 of 149 slices shown, 2 images]
[im 50/149  soft-tissue]
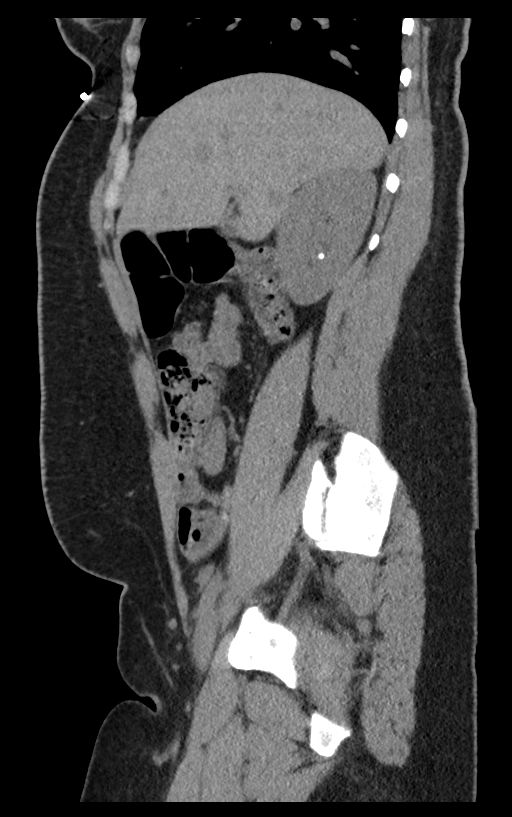
[im 50/149  bone]
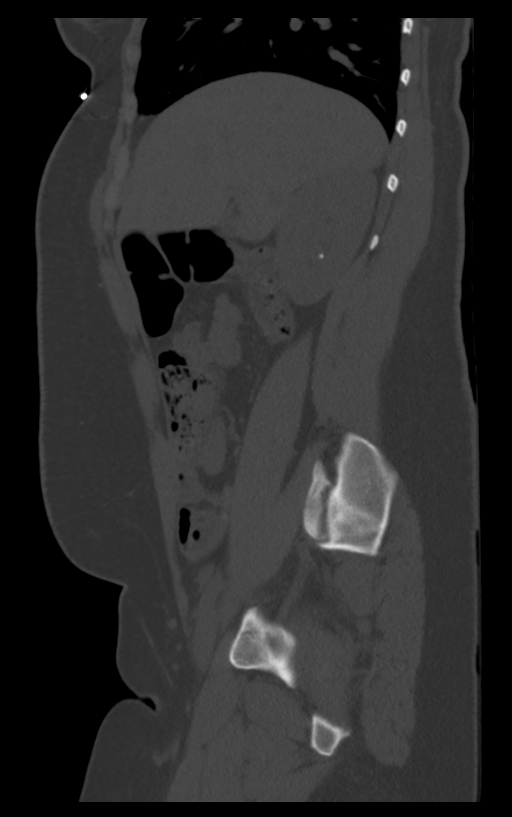

[9 of 46 positions shown; findings below may reference images not displayed]

FINDINGS: Lower chest: The visualized lung bases are grossly clear. The
visualized portions of the mediastinum are unremarkable.

Hepatobiliary: The liver is unremarkable in appearance. The
gallbladder is unremarkable in appearance. The common bile duct
remains normal in caliber.

Pancreas: The pancreas is within normal limits.

Spleen: The spleen is unremarkable in appearance.

Adrenals/Urinary Tract: The adrenal glands are unremarkable in
appearance.

A 3 mm stone is noted at the interpole region of the right kidney.
The kidneys are otherwise unremarkable. There is no evidence of
hydronephrosis. No obstructing ureteral stones are identified. No
perinephric stranding is seen.

Stomach/Bowel: The stomach is unremarkable in appearance. The small
bowel is within normal limits. The appendix is normal in caliber,
without evidence of appendicitis.

Minimal diverticulosis is noted along the ascending colon, without
evidence of diverticulitis.

Vascular/Lymphatic: The abdominal aorta is unremarkable in
appearance. The inferior vena cava is grossly unremarkable. No
retroperitoneal lymphadenopathy is seen. No pelvic sidewall
lymphadenopathy is identified.

Reproductive: The bladder is mildly distended and within normal
limits. The uterus is grossly unremarkable in appearance. The
ovaries are relatively symmetric. No suspicious adnexal masses are
seen.

Other: No additional soft tissue abnormalities are seen.

Musculoskeletal: No acute osseous abnormalities are identified. The
visualized musculature is unremarkable in appearance.
IMPRESSION: 1. No acute abnormality seen within the abdomen or pelvis.
2. 3 mm nonobstructing stone at the interpole region of the right
kidney.
3. Minimal diverticulosis along the ascending colon, without
evidence of diverticulitis.

## 2020-11-13 ENCOUNTER — Encounter: Payer: Self-pay | Admitting: Advanced Practice Midwife

## 2020-11-13 ENCOUNTER — Other Ambulatory Visit: Payer: Self-pay

## 2020-11-13 ENCOUNTER — Ambulatory Visit (LOCAL_COMMUNITY_HEALTH_CENTER): Payer: BC Managed Care – PPO | Admitting: Advanced Practice Midwife

## 2020-11-13 DIAGNOSIS — L732 Hidradenitis suppurativa: Secondary | ICD-10-CM | POA: Insufficient documentation

## 2020-11-13 DIAGNOSIS — Z3009 Encounter for other general counseling and advice on contraception: Secondary | ICD-10-CM | POA: Diagnosis not present

## 2020-11-13 DIAGNOSIS — E663 Overweight: Secondary | ICD-10-CM

## 2020-11-13 LAB — WET PREP FOR TRICH, YEAST, CLUE
Trichomonas Exam: NEGATIVE
Yeast Exam: NEGATIVE

## 2020-11-13 NOTE — Progress Notes (Addendum)
Wet mount reviewed, no treatment indicated. IUD consult done today. Patient desires Mirena, and wants to wait until April for insertion, states she will call later this month to set up appointment.Marland KitchenMarland KitchenBurt Knack, RN

## 2020-11-13 NOTE — Progress Notes (Signed)
Patient here for PE, Pap, and wants to discuss BCM. Last Pap and PE 04/23/2015. Last Pap NIL.Marland KitchenBurt Knack, RN

## 2020-11-13 NOTE — Progress Notes (Signed)
[facility] [address] Main Number: [phone]    Family Planning Visit- Initial Visit  Subjective:  Isabella Smith is a 30 y.o. SBF G1P1001(1 yo daughter) exsmoker   being seen today for an initial well woman visit and to discuss family planning options.  She is currently using None for pregnancy prevention. Patient reports she does not want a pregnancy in the next year.  Patient has the following medical conditions has Overweight BMI=28.4 and Hidradenitis suppurativa on their problem list.  Chief Complaint  Patient presents with  . Annual Exam  . Contraception    Also needs pap    Patient reports wants PE and IUD.  Last physical 04/23/15.  Last pap 04/23/15 neg; maybe in 2020 also?? LMP 11/02/20.  Last sex 05/2020 without condom; not with partner currently.  Last cig 2020. Last vaped 2019. Last cigar 2016. Last ETOH 11/10/20 (1 glasss [place])q 2 months.  Employed 36 hours/wk, not in school. Liivng with her mom, her baby, her brother  Patient denies MJ  Body mass index is 28.46 kg/m. - Patient is eligible for diabetes screening based on BMI and age >70?  not applicable HA1C ordered? not applicable  Patient reports 1  partner/s in last year. Desires STI screening?  Yes  Has patient been screened once for HCV in the past?  No  No results found for: HCVAB  Does the patient have current drug use (including MJ), have a partner with drug use, and/or has been incarcerated since last result? No  If yes-- Screen for HCV through [facility]   Does the patient meet criteria for HBV testing? No  Criteria:  -Household, sexual or needle sharing contact with HBV -History of drug use -HIV positive -Those with known Hep C   Health Maintenance Due  Topic Date Due  . Hepatitis C Screening  Never done  . HIV Screening  Never done  . PAP-Cervical Cytology Screening  Never done  . PAP SMEAR-Modifier  Never done  .  COVID-19 Vaccine (3 - Booster for Pfizer series) 04/09/2020    Review of Systems  All other systems reviewed and are negative.   The following portions of the patient's history were reviewed and updated as appropriate: allergies, current medications, past family history, past medical history, past social history, past surgical history and problem list. Problem list updated.   See flowsheet for other program required questions.  Objective:   Vitals:   11/13/20 1001  BP: 114/74  Weight: 150 lb 9.6 oz (68.3 kg)  Height: 5\' 1"  (1.549 m)    Physical Exam Constitutional:      Appearance: Normal appearance. She is normal weight.  HENT:     Head: Normocephalic and atraumatic.     Comments: Thyroid without masses or enlargement Negative cervical lymphadenopathy    Mouth/Throat:     Mouth: Mucous membranes are moist.     Comments: Has yearly dental exams Eyes:     Conjunctiva/sclera: Conjunctivae normal.  Cardiovascular:     Rate and Rhythm: Normal rate and regular rhythm.  Pulmonary:     Effort: Pulmonary effort is normal.     Breath sounds: Normal breath sounds.  Chest:  Breasts:     Right: Normal.     Left: Normal.    Abdominal:     Palpations: Abdomen is soft.     Comments: Soft without masses or tenderness Poor tone,   Genitourinary:    General: Normal  vulva.     Exam position: Lithotomy position.     Vagina: Vaginal discharge (white creamy leukorrhea, ph<4.5) present.     Cervix: Normal.     Uterus: Normal.      Adnexa: Right adnexa normal and left adnexa normal.     Rectum: Normal.  Musculoskeletal:        General: Normal range of motion.     Cervical back: Normal range of motion and neck supple.  Skin:    General: Skin is warm and dry.  Neurological:     Mental Status: She is alert.  Psychiatric:        Mood and Affect: Mood normal.       Assessment and Plan:  [name] is a 30 y.o. female presenting to the [facility]  Department for an initial well woman exam/family planning visit  Contraception counseling: Reviewed all forms of birth control options in the tiered based approach. available including abstinence; over the counter/barrier methods; hormonal contraceptive medication including pill, patch, ring, injection,contraceptive implant, ECP; hormonal and nonhormonal IUDs; permanent sterilization options including vasectomy and the various tubal sterilization modalities. Risks, benefits, and typical effectiveness rates were reviewed.  Questions were answered.  Written information was also given to the patient to review.  Patient desires Mirena, this was not prescribed for patient today. She will follow up in 1 week for surveillance.  She was told to call with any further questions, or with any concerns about this method of contraception.  Emphasized use of condoms 100% of the time for STI prevention.  Patient was notffered ECP. ECP was not accepted by the patient. ECP counseling was not given - see RN documentation  1. Overweight BMI=28.4   2. Family planning Treat wet mount per standing orders Immunization nurse consult Pt desires Mirena insertion--please assist pt with scheduling - WET PREP FOR TRICH, YEAST, CLUE - IGP, rfx Aptima HPV ASCU - Chlamydia/Gonorrhea [facility]  3. Hidradenitis suppurativa Pt counseled and given info sheet on tx     Return for IUD insertion.  No future appointments.  Alberteen Spindle, CNM

## 2020-11-19 LAB — IGP, RFX APTIMA HPV ASCU: PAP Smear Comment: 0

## 2021-01-29 ENCOUNTER — Telehealth: Payer: Self-pay

## 2021-01-29 NOTE — Telephone Encounter (Signed)
TC to patient to reschedule 02/01/2021  IUD insertion due to lack of provider availability. LM with number to call. Marland KitchenBurt Knack, RN

## 2021-02-01 ENCOUNTER — Ambulatory Visit: Payer: Medicaid Other

## 2021-02-01 NOTE — Telephone Encounter (Signed)
Phone call to pt. Left message that RN with [facility] calling to reschedule IUD appt due to provider availability. Gave numbers to call for reschedule.

## 2021-02-02 NOTE — Telephone Encounter (Signed)
Phone call to pt. Left message on voicemail that RN with [facility] is calling to reschedule appt.  Left two [facility] numbers for call for appt.

## 2021-02-19 ENCOUNTER — Other Ambulatory Visit: Payer: Self-pay

## 2021-02-19 ENCOUNTER — Encounter: Payer: Self-pay | Admitting: Emergency Medicine

## 2021-02-19 ENCOUNTER — Ambulatory Visit
Admission: EM | Admit: 2021-02-19 | Discharge: 2021-02-19 | Disposition: A | Discharge status: Home / Self Care | Payer: BC Managed Care – PPO | Attending: Emergency Medicine | Admitting: Emergency Medicine

## 2021-02-19 DIAGNOSIS — M545 Low back pain, unspecified: Secondary | ICD-10-CM

## 2021-02-19 DIAGNOSIS — N750 Cyst of Bartholin's gland: Secondary | ICD-10-CM

## 2021-02-19 MED ORDER — AMOXICILLIN-POT CLAVULANATE 875-125 MG PO TABS: 1.0000 | ORAL_TABLET | Freq: Two times a day (BID) | ORAL | 0 refills | Status: AC

## 2021-02-19 MED ORDER — IBUPROFEN 600 MG PO TABS: 600.0000 mg | ORAL_TABLET | Freq: Four times a day (QID) | ORAL | 0 refills | Status: AC | PRN

## 2021-02-19 MED ORDER — SULFAMETHOXAZOLE-TRIMETHOPRIM 800-160 MG PO TABS: 1.0000 | ORAL_TABLET | Freq: Two times a day (BID) | ORAL | 0 refills | Status: AC

## 2021-02-19 NOTE — ED Triage Notes (Signed)
Patient states that she has had a cyst on her left labia since yesterday.  Patient also reports back pain and nausea.

## 2021-02-19 NOTE — Discharge Instructions (Addendum)
Difficult to tell if this is a Bartholin's cyst/abscess versus labial abscess.  I am favoring Bartholin cyst/abscess.  Warm, moist compresses or sitz baths 3-4 times a day, Bactrim and Augmentin twice a day for 5 days, 600 mg of ibuprofen combined with 1000 mg of Tylenol together 3-4 times a day as needed for pain.  This will also help with your back.  Follow-up with your doctor regarding your back if you are not getting any better.  Please follow-up with OB/GYN if your cyst does not get better with conservative treatment.

## 2021-02-19 NOTE — ED Provider Notes (Signed)
HPI  SUBJECTIVE:  Isabella Smith is a 30 y.o. female who presents with 2 issues: First she reports a "knot "in her left labia starting last week.  This became swollen and painful yesterday.  No fevers, body aches, vaginal odor, bleeding, discharge, drainage.  She has not been sexually active since September 2021.  The pain is worse palpation, sitting, no alleviating factors.  She has not tried anything for this.  Second, she reports waking up with intermittent, nonmigratory, nonradiating achy low back pain lasting 2 to 3 minutes starting today.  She reports some nausea.  She denies vomiting, fevers.  She states that she had a small bowel movement earlier today which helped with the back pain.  States that she did not have a full bowel movement as she was afraid that it would "pop" the labial cyst.  No urinary complaints, recent heavy lifting, change in physical activity, trauma to the back, saddle anesthesia, bilateral radicular pain, distal numbness tingling, leg weakness, fecal or urinary incontinence, urinary retention.  No aggravating or alleviating factors.  She has not tried anything for this.  She has a past medical history of hidradenitis suppurativa.  No history of MRSA, diabetes, hypertension, BV.  LMP: 2 weeks ago.  Denies the possibility of being pregnant.  PMD: She is establishing care with [facility] primary care at [facility].   History reviewed. No pertinent past medical history.  History reviewed. No pertinent surgical history.  Family History  Problem Relation Age of Onset   Hypertension Maternal Grandmother    Hypertension Mother     Social History   Tobacco Use   Smoking status: Former    Pack years: 0.00    Types: E-cigarettes, Cigars, Cigarettes   Smokeless tobacco: Never  Vaping Use   Vaping Use: Never used  Substance Use Topics   Alcohol use: Yes    Alcohol/week: 1.0 standard drink    Types: 1 Standard drinks or equivalent per week    Comment: q2 months   Drug  use: Never    No current facility-administered medications for this encounter.  Current Outpatient Medications:    amoxicillin-clavulanate (AUGMENTIN) 875-125 MG tablet, Take 1 tablet by mouth 2 (two) times daily for 5 days., Disp: 10 tablet, Rfl: 0   ibuprofen (ADVIL) 600 MG tablet, Take 1 tablet (600 mg total) by mouth every 6 (six) hours as needed., Disp: 30 tablet, Rfl: 0   sulfamethoxazole-trimethoprim (BACTRIM DS) 800-160 MG tablet, Take 1 tablet by mouth 2 (two) times daily for 5 days., Disp: 10 tablet, Rfl: 0  No Known Allergies   ROS  As noted in HPI.   Physical Exam  BP 120/83 (BP Location: Left Arm)   Pulse 74   Temp 98.7 F (37.1 C) (Oral)   Resp 14   Ht 5\' 1"  (1.549 m)   Wt 68.5 kg   LMP 02/05/2021 (Approximate)   SpO2 100%   BMI 28.53 kg/m   Constitutional: Well developed, well nourished, no acute distress Eyes:  EOMI, conjunctiva normal bilaterally HENT: Normocephalic, atraumatic,mucus membranes moist Respiratory: Normal inspiratory effort Cardiovascular: Normal rate GI: nondistended. No suprapubic tenderness skin: No rash, skin intact GU: Tender round mass, labial swelling left side.  No expressible purulent drainage.  No overlying erythema.  No vaginal discharge.  Patient declined chaperone. Musculoskeletal: no CVAT.  No paralumbar tenderness, no muscle spasm. No bony tenderness. Bilateral lower extremities nontender, baseline ROM with intact DP pulses, No pain with int/ext rotation flex/extension hips bilaterally. SLR neg bilaterally.  Sensation baseline light touch bilaterally for Pt, DTR's symmetric and intact bilaterally KJ , Motor symmetric bilateral 5/5 hip flexion, quadriceps, hamstrings, EHL, foot dorsiflexion, foot plantarflexion Neurologic: Alert & oriented x 3, no focal neuro deficits Psychiatric: Speech and behavior appropriate   ED Course   Medications - No data to display  No orders of the defined types were placed in this  encounter.   No results found for this or any previous visit (from the past 24 hour(s)). No results found.  ED Clinical Impression  1. Bartholin cyst   2. Acute bilateral low back pain without sciatica      ED Assessment/Plan  1.  Bartholin cyst/abscess versus labial abscess: Feel that we can manage this expectantly.  Patient has no systemic signs or symptoms.  Do not have the materials for I&D and placement of a Word catheter available here.  Doubt gonorrhea or chlamydial infection as patient has not been sexually active since September 2021.  Up-to-date recommends Bactrim 1-2 DS tablets twice daily plus Augmentin 875 twice daily or Flagyl 500 3 times daily for 5 to 7 days.  Will send home with Bactrim and Augmentin, in addition to Tylenol/ibuprofen, warm compresses/sitz bath's.  We will refer to OB/GYN Dr. Jean Rosenthal at [facility] on-call if it does not resolve or if he gets worse.   2.  Back pain.  No historical evidence of uti, nephrolithiasis. No evidence of spinal cord involvement based on H&P.  Pain been < 6 week duration. No historical red flags as noted in HPI. No physical red flags such as fever, bony tenderness, lower extremity weakness, saddle anesthesia. Imaging not indicated at this time.  Will treat as musculoskeletal with ibuprofen/Tylenol.  Discussed MDM, treatment plan, and plan for follow-up with patient. Discussed sn/sx that should prompt return to the ED. patient agrees with plan.   Meds ordered this encounter  Medications   ibuprofen (ADVIL) 600 MG tablet    Sig: Take 1 tablet (600 mg total) by mouth every 6 (six) hours as needed.    Dispense:  30 tablet    Refill:  0   sulfamethoxazole-trimethoprim (BACTRIM DS) 800-160 MG tablet    Sig: Take 1 tablet by mouth 2 (two) times daily for 5 days.    Dispense:  10 tablet    Refill:  0   amoxicillin-clavulanate (AUGMENTIN) 875-125 MG tablet    Sig: Take 1 tablet by mouth 2 (two) times daily for 5 days.    Dispense:   10 tablet    Refill:  0    *This clinic note was created using Scientist, clinical (histocompatibility and immunogenetics). Therefore, there may be occasional mistakes despite careful proofreading.  ?     Domenick Gong, MD 02/19/21 [phone]

## 2021-03-05 ENCOUNTER — Telehealth: Payer: Self-pay | Admitting: Family Medicine

## 2021-03-05 NOTE — Telephone Encounter (Signed)
Patient called to schedule an IUD placement appointment. Let her know she'd get a call back from the provider.

## 2021-03-05 NOTE — Telephone Encounter (Addendum)
Phone call to pt. Last sex was September 2021. Pt desires Mirena IUD. Checklist completed. Pt to continue to abstain from sex until after IUD is placed. IUD insertion scheduled for 03/18/21.

## 2021-03-18 ENCOUNTER — Ambulatory Visit: Payer: Self-pay

## 2021-03-18 ENCOUNTER — Ambulatory Visit (LOCAL_COMMUNITY_HEALTH_CENTER): Payer: BC Managed Care – PPO | Admitting: Advanced Practice Midwife

## 2021-03-18 ENCOUNTER — Other Ambulatory Visit: Payer: Self-pay

## 2021-03-18 VITALS — BP 113/74 | Ht 61.0 in | Wt 156.6 lb

## 2021-03-18 DIAGNOSIS — F17200 Nicotine dependence, unspecified, uncomplicated: Secondary | ICD-10-CM

## 2021-03-18 DIAGNOSIS — Z30430 Encounter for insertion of intrauterine contraceptive device: Secondary | ICD-10-CM

## 2021-03-18 DIAGNOSIS — Z3009 Encounter for other general counseling and advice on contraception: Secondary | ICD-10-CM

## 2021-03-18 MED ORDER — LEVONORGESTREL 20 MCG/DAY IU IUD
1.0000 | INTRAUTERINE_SYSTEM | Freq: Once | INTRAUTERINE | Status: AC
  Administered 2021-03-18: 1 via INTRAUTERINE

## 2021-03-18 NOTE — Progress Notes (Signed)
Contraception/Family Planning VISIT ENCOUNTER NOTE  Subjective:   Isabella Smith is a 30 y.o. SBF G1P1001(30 yo) smoker female here for reproductive life counseling.  Desires Mirena for [facility].  Reports she does not want a pregnancy in the next year. Denies abnormal vaginal bleeding, discharge, pelvic pain, problems with intercourse or other gynecologic concerns. Smoker 10 cpd. Last sex 06/03/20 without condom. Last pap 11/13/20 neg. GC/Chlamydia neg 11/13/20   Gynecologic History Patient's last menstrual period was 03/03/2021 (exact date). Contraception: abstinence  Health Maintenance Due  Topic Date Due   Pneumococcal Vaccine 54-76 Years old (1 - PCV) Never done   HIV Screening  Never done   Hepatitis C Screening  Never done   COVID-19 Vaccine (3 - Booster for Pfizer series) 03/10/2020     The following portions of the patient's history were reviewed and updated as appropriate: allergies, current medications, past family history, past medical history, past social history, past surgical history and problem list.  Review of Systems Pertinent items are noted in HPI.   Objective:  BP 113/74   Ht 5\' 1"  (1.549 m)   Wt 156 lb 9.6 oz (71 kg)   LMP 03/03/2021 (Exact Date)   BMI 29.59 kg/m  Gen: well appearing, NAD HEENT: no scleral icterus CV: RR Lung: Normal WOB Ext: warm well perfused  PELVIC: Normal appearing external genitalia; normal appearing vaginal mucosa and cervix.  No abnormal discharge noted.  Normal uterine size, no other palpable masses, no uterine or adnexal tenderness.   Assessment and Plan:   Contraception counseling: Reviewed all forms of birth control options in the tiered based approach. available including abstinence; over the counter/barrier methods; hormonal contraceptive medication including pill, patch, ring, injection,contraceptive implant, ECP; hormonal and nonhormonal IUDs; permanent sterilization options including vasectomy and the various tubal  sterilization modalities. Risks, benefits, and typical effectiveness rates were reviewed.  Questions were answered.  Written information was also given to the patient to review.  Patient desires Mirena insertion, this was prescribed for patient. She will follow up in  prn for surveillance.  She was told to call with any further questions, or with any concerns about this method of contraception.  Emphasized use of condoms 100% of the time for STI prevention.  Patient was offered ECP. ECP was not accepted by the patient. ECP counseling was not given - see RN documentation  1. Encounter for IUD insertion    Patient presented to [facility] for IUD insertion. Her GC/CT screening was found to be up to date and using WHO criteria we can be reasonably certain she is not pregnant. See Flowsheet for IUD check list  IUD Insertion Procedure Note Patient identified, informed consent performed, consent signed.   Discussed risks of irregular bleeding, cramping, infection, malpositioning or misplacement of the IUD outside the uterus which may require further procedure such as laparoscopy. Time out was performed.    Speculum placed in the vagina.  Cervix visualized.  Cleaned with Betadine x 2.  Anterior cx Grasped anteriorly with a single tooth tenaculum.  Uterus sounded to 7 cm.  IUD placed per manufacturer's recommendations.  Strings trimmed to 3 cm. Tenaculum was removed, good hemostasis noted.  Patient tolerated procedure well.   Patient was given post-procedure instructions- both agency handout and verbally by provider.  She was advised to have backup contraception for one week.  Patient was also asked to check IUD strings periodically or follow up in 4 weeks for IUD check.  06/24/2022achd  2. Family planning  3. Smoker 10 cpd Counseled via 5 A's to stop smoking    Please refer to After Visit Summary for other counseling recommendations.   Return in about 1 year (around 03/18/2022) for yearly physical  exam.  Alberteen Spindle, CNM [facility] DEPARTMENT

## 2021-03-18 NOTE — Progress Notes (Signed)
Pt here for insertion of an IUD.  Mirena inserted without any complications. Berdie Ogren, RN

## 2021-06-27 ENCOUNTER — Ambulatory Visit
Admission: EM | Admit: 2021-06-27 | Discharge: 2021-06-27 | Disposition: A | Discharge status: Home / Self Care | Payer: BC Managed Care – PPO

## 2021-06-27 ENCOUNTER — Other Ambulatory Visit: Payer: Self-pay

## 2021-06-27 DIAGNOSIS — J069 Acute upper respiratory infection, unspecified: Secondary | ICD-10-CM | POA: Diagnosis not present

## 2021-06-27 DIAGNOSIS — H66001 Acute suppurative otitis media without spontaneous rupture of ear drum, right ear: Secondary | ICD-10-CM

## 2021-06-27 DIAGNOSIS — J0100 Acute maxillary sinusitis, unspecified: Secondary | ICD-10-CM

## 2021-06-27 MED ORDER — BENZONATATE 100 MG PO CAPS: 200.0000 mg | ORAL_CAPSULE | Freq: Three times a day (TID) | ORAL | 0 refills | Status: DC | PRN

## 2021-06-27 MED ORDER — AMOXICILLIN-POT CLAVULANATE 875-125 MG PO TABS: 1.0000 | ORAL_TABLET | Freq: Two times a day (BID) | ORAL | 0 refills | Status: DC

## 2021-06-27 MED ORDER — FLUTICASONE PROPIONATE 50 MCG/ACT NA SUSP: 2.0000 | Freq: Every day | NASAL | 0 refills | Status: DC

## 2021-06-27 NOTE — ED Provider Notes (Signed)
[facility]    CSN: [id number] Arrival date & time: 06/27/21  1302      History   Chief Complaint Chief Complaint  Patient presents with   Nasal Congestion    HPI Isabella Smith is a 30 y.o. female.   HPI  Nasal Congestion: Pt reports that starting Friday night she started to have nasal congestion and a dry cough. She initially thought this was her seasonal allergies however symptoms seem to be getting worse. She now has significant sinus congestion and some coughing fits. She denies fevers, SOB, wheezing. No known sick contacts.   History reviewed. No pertinent past medical history.  Patient Active Problem List   Diagnosis Date Noted   Smoker 10 cpd 03/18/2021   Overweight BMI=28.4 11/13/2020   Hidradenitis suppurativa 11/13/2020    History reviewed. No pertinent surgical history.  OB History     Gravida  1   Para  1   Term  1   Preterm      AB      Living  1      SAB      IAB      Ectopic      Multiple      Live Births  1            Home Medications    Prior to Admission medications   Medication Sig Start Date End Date Taking? Authorizing Provider  fluticasone (FLONASE) 50 MCG/ACT nasal spray 2 sprays per nostril every morning for allergies 04/02/13  Yes [provider]  levonorgestrel (MIRENA) 20 MCG/DAY IUD 1 each by Intrauterine route once.   Yes [provider]  ibuprofen (ADVIL) 600 MG tablet Take 1 tablet (600 mg total) by mouth every 6 (six) hours as needed. 02/19/21   Domenick Gong, MD    Family History Family History  Problem Relation Age of Onset   Hypertension Mother    Hypertension Maternal Grandmother     Social History Social History   Tobacco Use   Smoking status: Every Day    Packs/day: 0.50    Types: E-cigarettes, Cigars, Cigarettes   Smokeless tobacco: Never  Vaping Use   Vaping Use: Never used  Substance Use Topics   Alcohol use: Yes    Alcohol/week: 1.0 standard  drink    Types: 1 Standard drinks or equivalent per week    Comment: q2 months   Drug use: Never     Allergies   Patient has no known allergies.   Review of Systems Review of Systems  As stated above in HPI Physical Exam Triage Vital Signs ED Triage Vitals  Enc Vitals Group     BP 06/27/21 1336 121/75     Pulse Rate 06/27/21 1336 99     Resp 06/27/21 1336 19     Temp 06/27/21 1336 98.5 F (36.9 C)     Temp Source 06/27/21 1336 Oral     SpO2 06/27/21 1336 100 %     Weight 06/27/21 1337 158 lb (71.7 kg)     Height 06/27/21 1337 5\' 1"  (1.549 m)     Head Circumference --      Peak Flow --      Pain Score 06/27/21 1336 4     Pain Loc --      Pain Edu? --      Excl. in GC? --    No data found.  Updated Vital Signs BP 121/75 (BP Location: Left Arm)  Pulse 99   Temp 98.5 F (36.9 C) (Oral)   Resp 19   Ht 5\' 1"  (1.549 m)   Wt 158 lb (71.7 kg)   LMP 05/05/2021   SpO2 100%   BMI 29.85 kg/m   Physical Exam Vitals and nursing note reviewed.  Constitutional:      General: She is not in acute distress.    Appearance: Normal appearance. She is not ill-appearing, toxic-appearing or diaphoretic.     Comments: Pt sounds congested  HENT:     Head: Normocephalic and atraumatic.     Left Ear: Tympanic membrane normal.     Ears:     Comments: Erythema and edema of the right TM    Nose: Congestion (mild maxillary sinus tenderness) present.  Eyes:     General:        Right eye: No discharge.        Left eye: No discharge.     Extraocular Movements: Extraocular movements intact.     Conjunctiva/sclera: Conjunctivae normal.     Pupils: Pupils are equal, round, and reactive to light.  Cardiovascular:     Rate and Rhythm: Normal rate and regular rhythm.     Heart sounds: Normal heart sounds.  Pulmonary:     Effort: Pulmonary effort is normal.     Breath sounds: Normal breath sounds.  Musculoskeletal:     Cervical back: Normal range of motion and neck supple.   Lymphadenopathy:     Cervical: No cervical adenopathy.  Skin:    General: Skin is warm.  Neurological:     Mental Status: She is alert and oriented to person, place, and time.     UC Treatments / Results  Labs (all labs ordered are listed, but only abnormal results are displayed) Labs Reviewed - No data to display  EKG   Radiology No results found.  Procedures Procedures (including critical care time)  Medications Ordered in UC Medications - No data to display  Initial Impression / Assessment and Plan / UC Course  I have reviewed the triage vital signs and the nursing notes.  Pertinent labs & imaging results that were available during my care of the patient were reviewed by me and considered in my medical decision making (see chart for details).     New.  Discussed with patient that this is likely viral in nature.  I have recommended that she trial Flonase and Tessalon along with nasal lavage with distilled water first however if she gets a fever or her right ear or sinuses become significantly painful I am good to have her start the Augmentin medication.  Rest, hydration with water encouraged.  Follow-up. Final Clinical Impressions(s) / UC Diagnoses   Final diagnoses:  None   Discharge Instructions   None    ED Prescriptions   None    PDMP not reviewed this encounter.   05/07/2021, Rushie Chestnut 06/27/21 1424

## 2021-06-27 NOTE — ED Triage Notes (Signed)
Sx starting on Friday night. Nasal congestion, cough. Thought it was allergies but getting worse.

## 2021-09-21 ENCOUNTER — Other Ambulatory Visit: Payer: Self-pay

## 2021-12-27 ENCOUNTER — Ambulatory Visit: Admit: 2021-12-27 | Payer: BC Managed Care – PPO | Source: Home / Self Care

## 2022-03-16 ENCOUNTER — Other Ambulatory Visit: Payer: Self-pay

## 2022-03-16 ENCOUNTER — Ambulatory Visit
Admission: RE | Admit: 2022-03-16 | Discharge: 2022-03-16 | Disposition: A | Discharge status: Home / Self Care | Payer: BC Managed Care – PPO | Source: Ambulatory Visit | Attending: Emergency Medicine | Admitting: Emergency Medicine

## 2022-03-16 VITALS — BP 110/73 | HR 69 | Temp 98.6°F | Resp 16 | Ht 61.0 in | Wt 158.1 lb

## 2022-03-16 DIAGNOSIS — B9689 Other specified bacterial agents as the cause of diseases classified elsewhere: Secondary | ICD-10-CM

## 2022-03-16 DIAGNOSIS — L0231 Cutaneous abscess of buttock: Secondary | ICD-10-CM

## 2022-03-16 DIAGNOSIS — N760 Acute vaginitis: Secondary | ICD-10-CM

## 2022-03-16 LAB — URINALYSIS, ROUTINE W REFLEX MICROSCOPIC
Bilirubin Urine: NEGATIVE
Glucose, UA: NEGATIVE mg/dL
Leukocytes,Ua: NEGATIVE
Nitrite: NEGATIVE
Protein, ur: NEGATIVE mg/dL
Specific Gravity, Urine: 1.02 (ref 1.005–1.030)
pH: 7.5 (ref 5.0–8.0)

## 2022-03-16 LAB — WET PREP, GENITAL
Sperm: NONE SEEN
Trich, Wet Prep: NONE SEEN
WBC, Wet Prep HPF POC: <10 — AB (ref <10)
Yeast Wet Prep HPF POC: NONE SEEN

## 2022-03-16 LAB — URINALYSIS, MICROSCOPIC (REFLEX)

## 2022-03-16 MED ORDER — METRONIDAZOLE 500 MG PO TABS: 500.0000 mg | ORAL_TABLET | Freq: Two times a day (BID) | ORAL | 0 refills | Status: AC

## 2022-03-16 MED ORDER — CEPHALEXIN 500 MG PO CAPS: 500.0000 mg | ORAL_CAPSULE | Freq: Three times a day (TID) | ORAL | 0 refills | Status: AC

## 2022-03-16 NOTE — ED Provider Notes (Signed)
[facility]    CSN: [id number] Arrival date & time: 03/16/22  1341      History   Chief Complaint Chief Complaint  Patient presents with   Abscess   Flank Pain    HPI Isabella Smith is a 31 y.o. female.   HPI  31 year old female here for evaluation of multiple complaints.  Patient reports that Isabella Smith has been experiencing nausea and pain on her right flank for the past day.  Isabella Smith is also experienced a change in the odor of her urine but Isabella Smith denies fever, pain with urination, urinary urgency or frequency, blood in her urine, cloudiness in her urine, vomiting, or diarrhea.  Isabella Smith also denies vaginal itching or discharge.  Her second complaint is a swollen area on the upper part of her right buttock that has been present for last 2 days.  Isabella Smith states that Isabella Smith put ice on it and the swelling has gone down but Isabella Smith denies any drainage.  History reviewed. No pertinent past medical history.  Patient Active Problem List   Diagnosis Date Noted   Smoker 10 cpd 03/18/2021   Overweight BMI=28.4 11/13/2020   Hidradenitis suppurativa 11/13/2020    History reviewed. No pertinent surgical history.  OB History     Gravida  1   Para  1   Term  1   Preterm      AB      Living  1      SAB      IAB      Ectopic      Multiple      Live Births  1            Home Medications    Prior to Admission medications   Medication Sig Start Date End Date Taking? Authorizing Provider  cephALEXin (KEFLEX) 500 MG capsule Take 1 capsule (500 mg total) by mouth 3 (three) times daily for 7 days. 03/16/22 03/23/22 Yes Becky Augusta, NP  ibuprofen (ADVIL) 600 MG tablet Take 1 tablet (600 mg total) by mouth every 6 (six) hours as needed. 02/19/21  Yes Domenick Gong, MD  levonorgestrel (MIRENA) 20 MCG/DAY IUD 1 each by Intrauterine route once.   Yes [provider]  metroNIDAZOLE (FLAGYL) 500 MG tablet Take 1 tablet (500 mg total) by mouth 2 (two) times daily. 03/16/22   Yes Becky Augusta, NP  valACYclovir (VALTREX) 1000 MG tablet Take 1,000 mg by mouth daily. 03/14/22  Yes [provider]  fluticasone Aleda Grana) 50 MCG/ACT nasal spray 2 sprays per nostril every morning for allergies 04/02/13   [provider]    Family History Family History  Problem Relation Age of Onset   Hypertension Mother    Hypertension Maternal Grandmother     Social History Social History   Tobacco Use   Smoking status: Every Day    Packs/day: 0.50    Types: E-cigarettes, Cigars, Cigarettes   Smokeless tobacco: Never  Vaping Use   Vaping Use: Every day  Substance Use Topics   Alcohol use: Yes    Alcohol/week: 1.0 standard drink of alcohol    Types: 1 Standard drinks or equivalent per week    Comment: q2 months   Drug use: Never     Allergies   Patient has no known allergies.   Review of Systems Review of Systems  Constitutional:  Negative for fever.  Genitourinary:  Positive for flank pain. Negative for dysuria, frequency, hematuria, urgency, vaginal discharge and vaginal pain.  Skin:  Negative for color change.       Swollen tender area on upper right buttock.  Hematological: Negative.   Psychiatric/Behavioral: Negative.       Physical Exam Triage Vital Signs ED Triage Vitals  Enc Vitals Group     BP 03/16/22 1441 110/73     Pulse Rate 03/16/22 1441 69     Resp 03/16/22 1441 16     Temp 03/16/22 1441 98.6 F (37 C)     Temp Source 03/16/22 1441 Oral     SpO2 03/16/22 1441 100 %     Weight 03/16/22 1439 158 lb 1.1 oz (71.7 kg)     Height 03/16/22 1439 5\' 1"  (1.549 m)     Head Circumference --      Peak Flow --      Pain Score 03/16/22 1438 6     Pain Loc --      Pain Edu? --      Excl. in GC? --    No data found.  Updated Vital Signs BP 110/73 (BP Location: Right Arm)   Pulse 69   Temp 98.6 F (37 C) (Oral)   Resp 16   Ht 5\' 1"  (1.549 m)   Wt 158 lb 1.1 oz (71.7 kg)   SpO2 100%   BMI 29.87 kg/m   Visual  Acuity Right Eye Distance:   Left Eye Distance:   Bilateral Distance:    Right Eye Near:   Left Eye Near:    Bilateral Near:     Physical Exam Vitals and nursing note reviewed.  Constitutional:      Appearance: Normal appearance. Isabella Smith is not ill-appearing.  HENT:     Head: Normocephalic and atraumatic.  Cardiovascular:     Rate and Rhythm: Normal rate and regular rhythm.     Pulses: Normal pulses.     Heart sounds: Normal heart sounds. No murmur heard.    No friction rub. No gallop.  Pulmonary:     Effort: Pulmonary effort is normal.     Breath sounds: Normal breath sounds. No wheezing, rhonchi or rales.  Abdominal:     General: Abdomen is flat.     Palpations: Abdomen is soft.     Tenderness: There is no abdominal tenderness. There is no right CVA tenderness, left CVA tenderness, guarding or rebound.  Skin:    General: Skin is warm and dry.     Capillary Refill: Capillary refill takes less than 2 seconds.     Findings: Lesion present. No erythema.  Neurological:     General: No focal deficit present.     Mental Status: Isabella Smith is alert and oriented to person, place, and time.  Psychiatric:        Mood and Affect: Mood normal.        Behavior: Behavior normal.        Thought Content: Thought content normal.        Judgment: Judgment normal.      UC Treatments / Results  Labs (all labs ordered are listed, but only abnormal results are displayed) Labs Reviewed  WET PREP, GENITAL - Abnormal; Notable for the following components:      Result Value   Clue Cells Wet Prep HPF POC PRESENT (*)    WBC, Wet Prep HPF POC <10 (*)    All other components within normal limits  URINALYSIS, ROUTINE W REFLEX MICROSCOPIC - Abnormal; Notable for the following components:   APPearance HAZY (*)    Hgb urine  dipstick TRACE (*)    Ketones, ur TRACE (*)    All other components within normal limits  URINALYSIS, MICROSCOPIC (REFLEX) - Abnormal; Notable for the following components:    Bacteria, UA FEW (*)    All other components within normal limits    EKG   Radiology No results found.  Procedures Procedures (including critical care time)  Medications Ordered in UC Medications - No data to display  Initial Impression / Assessment and Plan / UC Course  I have reviewed the triage vital signs and the nursing notes.  Pertinent labs & imaging results that were available during my care of the patient were reviewed by me and considered in my medical decision making (see chart for details).  Very pleasant, nontoxic-appearing 31 year old female here for evaluation of GU and skin complaints as outlined in HPI above.  Her physical exam reveals a benign cardiopulmonary exam with S1-S2 heart sounds with regular rate and rhythm and lung sounds are clear to auscultation all fields.  No CVA tenderness on exam.  Abdomen is soft, flat, and nontender to palpation.  Integumentary exam reveals a nickel sized area of mild induration on the superior right buttock.  There is no overlying erythema or fluctuance noted.  This may be an immature abscess development.  I have advised the patient to continue applying warm compresses to the area deceiving get it to come to ahead and express itself.  I will also place her on oral antibiotics to help resolve this developing infection.  Urinalysis was collected at triage and is pending.  Urinalysis shows hazy appearance with trace hemoglobin and trace ketones.  Negative for leukocyte esterase, nitrates, or protein.  Reflex microscopy shows 6-10 RBCs, 0-5 WBCs, few bacteria and 6-10 squamous epithelials.  Will order vaginal wet prep.  Wet prep is positive for clue cells but negative for yeast or trichomonas.  I will discharge the patient home with a diagnosis of immature skin abscess as well as bacterial vaginosis.  I will treat her with Keflex and metronidazole.  Work note provided and return precautions reviewed.   Final Clinical Impressions(s) /  UC Diagnoses   Final diagnoses:  Abscess of buttock, right  BV (bacterial vaginosis)     Discharge Instructions      Take the Flagyl (metronidazole) 500 mg twice daily for treatment of your bacterial vaginosis.  Avoid alcohol while on the metronidazole as taken together will cause of vomiting.  Bacterial vaginosis is often caused by a imbalance of bacteria in your vaginal vault.  This is sometimes a result of using tampons or hormonal fluctuations during her menstrual cycle.  You if your symptoms are recurrent you can try using a boric acid suppository twice weekly to help maintain the acid-base balance in your vagina vault which could prevent further infection.  You can also try vaginal probiotics to help return normal bacterial balance.   For the abscess that is developing in her right buttock please take Keflex 3 times a day with food for 7 days.  Apply warm compresses to the area to help it mature and express on its own.  Please return for new or worsening symptoms.     ED Prescriptions     Medication Sig Dispense Auth. Provider   cephALEXin (KEFLEX) 500 MG capsule Take 1 capsule (500 mg total) by mouth 3 (three) times daily for 7 days. 20 capsule Becky Augusta, NP   metroNIDAZOLE (FLAGYL) 500 MG tablet Take 1 tablet (500 mg total) by mouth 2 (  two) times daily. 14 tablet Becky Augusta, NP      PDMP not reviewed this encounter.   Becky Augusta, NP 03/16/22 1523

## 2022-03-16 NOTE — Discharge Instructions (Signed)
Take the Flagyl (metronidazole) 500 mg twice daily for treatment of your bacterial vaginosis.  Avoid alcohol while on the metronidazole as taken together will cause of vomiting.  Bacterial vaginosis is often caused by a imbalance of bacteria in your vaginal vault.  This is sometimes a result of using tampons or hormonal fluctuations during her menstrual cycle.  You if your symptoms are recurrent you can try using a boric acid suppository twice weekly to help maintain the acid-base balance in your vagina vault which could prevent further infection.  You can also try vaginal probiotics to help return normal bacterial balance.   For the abscess that is developing in her right buttock please take Keflex 3 times a day with food for 7 days.  Apply warm compresses to the area to help it mature and express on its own.  Please return for new or worsening symptoms.

## 2022-03-16 NOTE — ED Triage Notes (Addendum)
Pt c/o abscess on her right butt cheek, nausea, and left sided flank pain. Started about 3 days ago. Denies dysuria.
